# Patient Record
Sex: Female | Born: 1955 | Race: Black or African American | Hispanic: No | Marital: Married | Smoking: Never smoker
Health system: Southern US, Community
[De-identification: ages and names within clinical notes are randomized; demographics above are authoritative.]

## PROBLEM LIST (undated history)

## (undated) DIAGNOSIS — D259 Leiomyoma of uterus, unspecified: Secondary | ICD-10-CM

## (undated) DIAGNOSIS — R7302 Impaired glucose tolerance (oral): Secondary | ICD-10-CM

## (undated) DIAGNOSIS — M1990 Unspecified osteoarthritis, unspecified site: Secondary | ICD-10-CM

## (undated) DIAGNOSIS — I2510 Atherosclerotic heart disease of native coronary artery without angina pectoris: Secondary | ICD-10-CM

## (undated) DIAGNOSIS — E669 Obesity, unspecified: Secondary | ICD-10-CM

## (undated) DIAGNOSIS — R7303 Prediabetes: Secondary | ICD-10-CM

## (undated) DIAGNOSIS — I209 Angina pectoris, unspecified: Secondary | ICD-10-CM

## (undated) DIAGNOSIS — E7800 Pure hypercholesterolemia, unspecified: Secondary | ICD-10-CM

## (undated) HISTORY — DX: Impaired glucose tolerance (oral): R73.02

## (undated) HISTORY — PX: CORONARY ANGIOPLASTY WITH STENT PLACEMENT: SHX49

## (undated) HISTORY — DX: Obesity, unspecified: E66.9

---

## 1990-06-16 HISTORY — PX: TUBAL LIGATION: SHX77

## 1997-09-14 ENCOUNTER — Ambulatory Visit (HOSPITAL_COMMUNITY): Admission: RE | Admit: 1997-09-14 | Discharge: 1997-09-14 | Payer: Self-pay | Admitting: Obstetrics and Gynecology

## 1998-11-14 ENCOUNTER — Ambulatory Visit (HOSPITAL_COMMUNITY): Admission: RE | Admit: 1998-11-14 | Discharge: 1998-11-14 | Payer: Self-pay | Admitting: Obstetrics and Gynecology

## 1998-11-14 ENCOUNTER — Encounter: Payer: Self-pay | Admitting: Obstetrics and Gynecology

## 1998-11-26 ENCOUNTER — Other Ambulatory Visit: Admission: RE | Admit: 1998-11-26 | Discharge: 1998-11-26 | Payer: Self-pay | Admitting: Obstetrics and Gynecology

## 1999-11-07 ENCOUNTER — Ambulatory Visit (HOSPITAL_COMMUNITY): Admission: RE | Admit: 1999-11-07 | Discharge: 1999-11-07 | Payer: Self-pay | Admitting: Obstetrics and Gynecology

## 1999-11-07 ENCOUNTER — Encounter: Payer: Self-pay | Admitting: Obstetrics and Gynecology

## 1999-11-14 ENCOUNTER — Encounter: Payer: Self-pay | Admitting: Obstetrics and Gynecology

## 1999-11-14 ENCOUNTER — Encounter: Admission: RE | Admit: 1999-11-14 | Discharge: 1999-11-14 | Payer: Self-pay | Admitting: Obstetrics and Gynecology

## 2000-07-07 ENCOUNTER — Other Ambulatory Visit: Admission: RE | Admit: 2000-07-07 | Discharge: 2000-07-07 | Payer: Self-pay | Admitting: Obstetrics and Gynecology

## 2001-05-24 ENCOUNTER — Emergency Department (HOSPITAL_COMMUNITY): Admission: EM | Admit: 2001-05-24 | Discharge: 2001-05-24 | Payer: Self-pay

## 2001-10-12 ENCOUNTER — Other Ambulatory Visit: Admission: RE | Admit: 2001-10-12 | Discharge: 2001-10-12 | Payer: Self-pay | Admitting: Obstetrics and Gynecology

## 2002-08-24 ENCOUNTER — Ambulatory Visit (HOSPITAL_COMMUNITY): Admission: RE | Admit: 2002-08-24 | Discharge: 2002-08-24 | Payer: Self-pay | Admitting: Gastroenterology

## 2002-08-24 ENCOUNTER — Encounter: Payer: Self-pay | Admitting: Obstetrics and Gynecology

## 2002-10-25 ENCOUNTER — Other Ambulatory Visit: Admission: RE | Admit: 2002-10-25 | Discharge: 2002-10-25 | Payer: Self-pay | Admitting: Obstetrics and Gynecology

## 2003-11-22 ENCOUNTER — Ambulatory Visit (HOSPITAL_COMMUNITY): Admission: RE | Admit: 2003-11-22 | Discharge: 2003-11-22 | Payer: Self-pay | Admitting: *Deleted

## 2004-12-06 ENCOUNTER — Ambulatory Visit (HOSPITAL_COMMUNITY): Admission: RE | Admit: 2004-12-06 | Discharge: 2004-12-06 | Payer: Self-pay | Admitting: Obstetrics and Gynecology

## 2007-01-05 ENCOUNTER — Ambulatory Visit (HOSPITAL_COMMUNITY): Admission: RE | Admit: 2007-01-05 | Discharge: 2007-01-05 | Payer: Self-pay | Admitting: Obstetrics and Gynecology

## 2007-06-17 HISTORY — PX: HEMORRHOID BANDING: SHX5850

## 2008-01-07 ENCOUNTER — Ambulatory Visit (HOSPITAL_COMMUNITY): Admission: RE | Admit: 2008-01-07 | Discharge: 2008-01-07 | Payer: Self-pay | Admitting: Obstetrics and Gynecology

## 2009-01-12 ENCOUNTER — Ambulatory Visit (HOSPITAL_COMMUNITY): Admission: RE | Admit: 2009-01-12 | Discharge: 2009-01-12 | Payer: Self-pay | Admitting: Obstetrics and Gynecology

## 2010-06-20 ENCOUNTER — Ambulatory Visit (HOSPITAL_COMMUNITY)
Admission: RE | Admit: 2010-06-20 | Discharge: 2010-06-20 | Payer: Self-pay | Source: Home / Self Care | Attending: Obstetrics and Gynecology | Admitting: Obstetrics and Gynecology

## 2011-08-24 ENCOUNTER — Ambulatory Visit: Payer: BC Managed Care – PPO

## 2011-08-24 ENCOUNTER — Ambulatory Visit (INDEPENDENT_AMBULATORY_CARE_PROVIDER_SITE_OTHER): Payer: BC Managed Care – PPO | Admitting: Internal Medicine

## 2011-08-24 DIAGNOSIS — R7302 Impaired glucose tolerance (oral): Secondary | ICD-10-CM

## 2011-08-24 DIAGNOSIS — E669 Obesity, unspecified: Secondary | ICD-10-CM | POA: Insufficient documentation

## 2011-08-24 DIAGNOSIS — R0602 Shortness of breath: Secondary | ICD-10-CM

## 2011-08-24 DIAGNOSIS — R05 Cough: Secondary | ICD-10-CM

## 2011-08-24 DIAGNOSIS — R079 Chest pain, unspecified: Secondary | ICD-10-CM

## 2011-08-24 DIAGNOSIS — R5383 Other fatigue: Secondary | ICD-10-CM

## 2011-08-24 DIAGNOSIS — R7309 Other abnormal glucose: Secondary | ICD-10-CM

## 2011-08-24 LAB — POCT CBC
Hemoglobin: 13 g/dL (ref 12.2–16.2)
MPV: 9.3 fL (ref 0–99.8)
POC Granulocyte: 1.5 — AB (ref 2–6.9)
POC MID %: 5.2 %M (ref 0–12)
RBC: 4.55 M/uL (ref 4.04–5.48)
WBC: 3.9 10*3/uL — AB (ref 4.6–10.2)

## 2011-08-24 LAB — COMPREHENSIVE METABOLIC PANEL
ALT: 11 U/L (ref 0–35)
Alkaline Phosphatase: 52 U/L (ref 39–117)
Sodium: 142 mEq/L (ref 135–145)
Total Bilirubin: 0.5 mg/dL (ref 0.3–1.2)
Total Protein: 7.4 g/dL (ref 6.0–8.3)

## 2011-08-24 LAB — LIPID PANEL
LDL Cholesterol: 114 mg/dL — ABNORMAL HIGH (ref 0–99)
Triglycerides: 99 mg/dL (ref <150)
VLDL: 20 mg/dL (ref 0–40)

## 2011-08-24 LAB — GLUCOSE, POCT (MANUAL RESULT ENTRY): POC Glucose: 84

## 2011-08-24 LAB — POCT SEDIMENTATION RATE: POCT SED RATE: 24 mm/hr — AB (ref 0–22)

## 2011-08-24 MED ORDER — HYDROCODONE-ACETAMINOPHEN 7.5-500 MG/15ML PO SOLN: 5.0000 mL | Freq: Four times a day (QID) | ORAL | Status: AC | PRN

## 2011-08-24 NOTE — Progress Notes (Signed)
  Subjective:    Patient ID: Emma Combs, female    DOB: [birth year], 56 y.o.   MRN: [id number]  HPI 56 y/o [place] female with hx of glucose intol. And obesity presents with vague chest pain , arm  Pain,  Fatigue and sob.  3 wks ago was hauling stuff to her daughters dorm room  And felt chest pressure, arm pain and extreme fatigue. She feels better now but still fatigued.  Also has had a URI with cough recently.   Review of Systems As above    Objective:   Physical Exam  Constitutional: She is oriented to person, place, and time. She appears well-developed and well-nourished.  HENT:  Nose: Nose normal.  Mouth/Throat: Oropharynx is clear and moist. No oropharyngeal exudate.  Eyes: EOM are normal.  Neck: Normal range of motion. No thyromegaly present.  Cardiovascular: Normal rate, regular rhythm and normal heart sounds.   Pulmonary/Chest: Effort normal and breath sounds normal.  Abdominal: Soft. Bowel sounds are normal. There is no tenderness.  Musculoskeletal: Normal range of motion.  Neurological: She is alert and oriented to person, place, and time. Coordination normal.  Skin: Skin is warm and dry.  Psychiatric: She has a normal mood and affect.    [facility] reading (PRIMARY) by  Dr. Perrin Maltese normal CXR  Results for orders placed in visit on 08/24/11  POCT CBC      Component Value Range   WBC 3.9 (*) 4.6 - 10.2 (K/uL)   Lymph, poc 2.2  0.6 - 3.4    POC LYMPH PERCENT 56.2 (*) 10 - 50 (%L)   MID (cbc) 0.2  0 - 0.9    POC MID % 5.2  0 - 12 (%M)   POC Granulocyte 1.5 (*) 2 - 6.9    Granulocyte percent 38.6  37 - 80 (%G)   RBC 4.55  4.04 - 5.48 (M/uL)   Hemoglobin 13.0  12.2 - 16.2 (g/dL)   HCT, POC 40.9  81.1 - 47.9 (%)   MCV 89.4  80 - 97 (fL)   MCH, POC 28.6  27 - 31.2 (pg)   MCHC 31.9  31.8 - 35.4 (g/dL)   RDW, POC 91.4     Platelet Count, POC 163  142 - 424 (K/uL)   MPV 9.3  0 - 99.8 (fL)  GLUCOSE, POCT (MANUAL RESULT ENTRY)      Component Value Range   POC Glucose 84     POCT GLYCOSYLATED HEMOGLOBIN (HGB A1C)      Component Value Range   Hemoglobin A1C 6.1         Labs above    Assessment & Plan:   Chest pain cause unclear with nl cxr and ekg Fatigue cause unclear Cough resolving LRI. R/o CAD, refer to cardiology for full evaluation  Best phone# [phone]  Lose wt., take 81mg  asa qd.Lateasha Breuer WARREN

## 2011-08-24 NOTE — Patient Instructions (Signed)
Chest Pain (Nonspecific) It is often hard to give a specific diagnosis for the cause of chest pain. There is always a chance that your pain could be related to something serious, such as a heart attack or a blood clot in the lungs. You need to follow up with your caregiver for further evaluation. CAUSES   Heartburn.   Pneumonia or bronchitis.   Anxiety or stress.   Inflammation around your heart (pericarditis) or lung (pleuritis or pleurisy).   A blood clot in the lung.   A collapsed lung (pneumothorax). It can develop suddenly on its own (spontaneous pneumothorax) or from injury (trauma) to the chest.   Shingles infection (herpes zoster virus).  The chest wall is composed of bones, muscles, and cartilage. Any of these can be the source of the pain.  The bones can be bruised by injury.   The muscles or cartilage can be strained by coughing or overwork.   The cartilage can be affected by inflammation and become sore (costochondritis).  DIAGNOSIS  Lab tests or other studies, such as X-rays, electrocardiography, stress testing, or cardiac imaging, may be needed to find the cause of your pain.  TREATMENT   Treatment depends on what may be causing your chest pain. Treatment may include:   Acid blockers for heartburn.   Anti-inflammatory medicine.   Pain medicine for inflammatory conditions.   Antibiotics if an infection is present.   You may be advised to change lifestyle habits. This includes stopping smoking and avoiding alcohol, caffeine, and chocolate.   You may be advised to keep your head raised (elevated) when sleeping. This reduces the chance of acid going backward from your stomach into your esophagus.   Most of the time, nonspecific chest pain will improve within 2 to 3 days with rest and mild pain medicine.  HOME CARE INSTRUCTIONS   If antibiotics were prescribed, take your antibiotics as directed. Finish them even if you start to feel better.   For the next few  days, avoid physical activities that bring on chest pain. Continue physical activities as directed.   Do not smoke.   Avoid drinking alcohol.   Only take over-the-counter or prescription medicine for pain, discomfort, or fever as directed by your caregiver.   Follow your caregiver's suggestions for further testing if your chest pain does not go away.   Keep any follow-up appointments you made. If you do not go to an appointment, you could develop lasting (chronic) problems with pain. If there is any problem keeping an appointment, you must call to reschedule.  SEEK MEDICAL CARE IF:   You think you are having problems from the medicine you are taking. Read your medicine instructions carefully.   Your chest pain does not go away, even after treatment.   You develop a rash with blisters on your chest.  SEEK IMMEDIATE MEDICAL CARE IF:   You have increased chest pain or pain that spreads to your arm, neck, jaw, back, or abdomen.   You develop shortness of breath, an increasing cough, or you are coughing up blood.   You have severe back or abdominal pain, feel nauseous, or vomit.   You develop severe weakness, fainting, or chills.   You have a fever.  THIS IS AN EMERGENCY. Do not wait to see if the pain will go away. Get medical help at once. Call your local emergency services (911 in [place] Do not drive yourself to the hospital. MAKE SURE YOU:   Understand these instructions.     Will watch your condition.   Will get help right away if you are not doing well or get worse.  Document Released: 03/12/2005 Document Revised: 05/22/2011 Document Reviewed: 01/06/2008 Exit[employer] Patient Information 2012 [employer]. 

## 2011-08-26 ENCOUNTER — Encounter: Payer: Self-pay | Admitting: *Deleted

## 2011-09-03 ENCOUNTER — Institutional Professional Consult (permissible substitution): Payer: BC Managed Care – PPO | Admitting: Cardiology

## 2011-09-26 ENCOUNTER — Institutional Professional Consult (permissible substitution): Payer: BC Managed Care – PPO | Admitting: Cardiology

## 2011-10-15 ENCOUNTER — Ambulatory Visit (INDEPENDENT_AMBULATORY_CARE_PROVIDER_SITE_OTHER): Payer: BC Managed Care – PPO | Admitting: Physician Assistant

## 2011-10-15 VITALS — BP 131/84 | HR 71 | Temp 98.1°F | Resp 18 | Ht 65.0 in | Wt 209.0 lb

## 2011-10-15 DIAGNOSIS — M25569 Pain in unspecified knee: Secondary | ICD-10-CM

## 2011-10-15 MED ORDER — MELOXICAM 15 MG PO TABS: 15.0000 mg | ORAL_TABLET | Freq: Every day | ORAL | Status: AC

## 2011-10-15 NOTE — Patient Instructions (Signed)
You make take the new prescription, Meloxicam with the liquid hydrocodone that you have.  Stop the naproxen.  Do not take ibuprofen, advil, aleeve. Heat the injured area.  Wear the ace wrap for compression or purchase a knee sleeve from the pharmacy. Watch for any lower leg swelling or swelling of the knee. Call if you have any questions.

## 2011-10-15 NOTE — Progress Notes (Signed)
  Subjective:    Patient ID: Emma Combs, female    DOB: Aug 25, 1955, 56 y.o.   MRN: [id number]  HPI Ms. Montelongo comes in tonight c/o 2 days of right posterior medial knee pain that started just after she got into her car, driverside.  Got worse as she drove home and has continued to be painful.  Took old naproxen day one that helped then but yesterday it was much worse and couldn't sleep last night.  Lifting knee hurts the most. She does not remember a specific injury and did nothing new. She has never had trouble with her knees in the past.  Healthy otherwise.   Review of Systems  Constitutional: Negative for fever.  Respiratory: Negative for shortness of breath.   Cardiovascular: Negative for chest pain.  Musculoskeletal: Positive for arthralgias. Negative for joint swelling.  Neurological: Negative for weakness.       Objective:   Physical Exam  Cardiovascular: Intact distal pulses.  Exam reveals no decreased pulses.   Musculoskeletal:       Right knee: She exhibits decreased range of motion. She exhibits no swelling, no effusion, no ecchymosis, no deformity and no erythema. no tenderness found. No medial joint line, no lateral joint line, no MCL, no LCL and no patellar tendon tenderness noted.       Legs:      Pain at medial hamstring insertion with resistive knee flexion at 90 degrees.  No pain with knee extension. No calf tenderness.   Skin: Skin is warm and dry.          Assessment & Plan:  Knee pain/strain  ACE wrap for compression or purchase knee sleeve D/C Naproxen. Start Meloxicam Pt has lortab elixir.  Will use for worsening pain at night. Heat. Elevate. Watch for swelling, calf pain or worsening sx.

## 2012-01-21 ENCOUNTER — Other Ambulatory Visit (HOSPITAL_COMMUNITY): Payer: Self-pay | Admitting: Obstetrics & Gynecology

## 2012-01-21 DIAGNOSIS — Z1231 Encounter for screening mammogram for malignant neoplasm of breast: Secondary | ICD-10-CM

## 2012-02-03 ENCOUNTER — Ambulatory Visit (HOSPITAL_COMMUNITY)
Admission: RE | Admit: 2012-02-03 | Discharge: 2012-02-03 | Disposition: A | Discharge status: Home / Self Care | Payer: BC Managed Care – PPO | Source: Ambulatory Visit | Attending: Obstetrics & Gynecology | Admitting: Obstetrics & Gynecology

## 2012-02-03 DIAGNOSIS — Z1231 Encounter for screening mammogram for malignant neoplasm of breast: Secondary | ICD-10-CM | POA: Insufficient documentation

## 2013-01-14 ENCOUNTER — Other Ambulatory Visit (HOSPITAL_COMMUNITY): Payer: Self-pay | Admitting: Obstetrics & Gynecology

## 2013-01-14 DIAGNOSIS — Z1231 Encounter for screening mammogram for malignant neoplasm of breast: Secondary | ICD-10-CM

## 2013-02-04 ENCOUNTER — Ambulatory Visit (HOSPITAL_COMMUNITY)
Admission: RE | Admit: 2013-02-04 | Discharge: 2013-02-04 | Disposition: A | Discharge status: Home / Self Care | Payer: BC Managed Care – PPO | Source: Ambulatory Visit | Attending: Obstetrics & Gynecology | Admitting: Obstetrics & Gynecology

## 2013-02-04 DIAGNOSIS — Z1231 Encounter for screening mammogram for malignant neoplasm of breast: Secondary | ICD-10-CM | POA: Insufficient documentation

## 2013-05-28 ENCOUNTER — Ambulatory Visit: Payer: BC Managed Care – PPO

## 2013-05-28 ENCOUNTER — Ambulatory Visit (INDEPENDENT_AMBULATORY_CARE_PROVIDER_SITE_OTHER): Payer: BC Managed Care – PPO | Admitting: Family Medicine

## 2013-05-28 VITALS — BP 126/74 | HR 93 | Temp 97.9°F | Resp 16 | Ht 65.0 in | Wt 217.0 lb

## 2013-05-28 DIAGNOSIS — M25531 Pain in right wrist: Secondary | ICD-10-CM

## 2013-05-28 DIAGNOSIS — M65839 Other synovitis and tenosynovitis, unspecified forearm: Secondary | ICD-10-CM

## 2013-05-28 DIAGNOSIS — M778 Other enthesopathies, not elsewhere classified: Secondary | ICD-10-CM

## 2013-05-28 DIAGNOSIS — M25539 Pain in unspecified wrist: Secondary | ICD-10-CM

## 2013-05-28 MED ORDER — DICLOFENAC SODIUM 75 MG PO TBEC: 75.0000 mg | DELAYED_RELEASE_TABLET | Freq: Two times a day (BID) | ORAL | Status: DC

## 2013-05-28 NOTE — Patient Instructions (Signed)
Try to rest the wrist as much as possible  Take diclofenac one pill twice daily with food at breakfast and supper for the tendinitis of the wrist  Wear the wrist splint as directed.  Return if not improving

## 2013-05-28 NOTE — Progress Notes (Signed)
Subjective: 57 year old lady who is here with problems with pain in her right forearm and wrist and hand. This been going on for some time. She does use her hands a lot as she is right-handed and works as a Engineer, site. Has not really taken or done anything for it. No specific injury.  Objective: Pleasant lady in no major distress. Her right hand appears to have a little swelling on the back of the wrist. When she moves the wrist around it hurts her a good deal. When she grips tightly and stabilize his wrist he really doesn't seem to cause her much pain. There is pain both on flexion extension and lateral movements.  Assessment: Right wrist pain  Plan: X-ray wrist  [facility] reading (PRIMARY) by  Dr. Alwyn Ren Normal wrist  Assessment: Tendinitis right wrist  Plan: Wrist splint Diclofenac 75 mg one twice daily for 2 weeks with one refill  Return if problems persist.

## 2013-07-23 ENCOUNTER — Emergency Department (HOSPITAL_COMMUNITY)
Admission: EM | Admit: 2013-07-23 | Discharge: 2013-07-23 | Disposition: A | Discharge status: Home / Self Care | Payer: BC Managed Care – PPO | Attending: Emergency Medicine | Admitting: Emergency Medicine

## 2013-07-23 ENCOUNTER — Emergency Department (HOSPITAL_COMMUNITY): Payer: BC Managed Care – PPO

## 2013-07-23 ENCOUNTER — Ambulatory Visit (INDEPENDENT_AMBULATORY_CARE_PROVIDER_SITE_OTHER): Payer: BC Managed Care – PPO | Admitting: Family Medicine

## 2013-07-23 ENCOUNTER — Encounter (HOSPITAL_COMMUNITY): Payer: Self-pay | Admitting: Emergency Medicine

## 2013-07-23 VITALS — BP 132/90 | HR 78 | Temp 98.2°F | Resp 18 | Wt 212.0 lb

## 2013-07-23 DIAGNOSIS — Z79899 Other long term (current) drug therapy: Secondary | ICD-10-CM | POA: Insufficient documentation

## 2013-07-23 DIAGNOSIS — R079 Chest pain, unspecified: Secondary | ICD-10-CM

## 2013-07-23 DIAGNOSIS — R0602 Shortness of breath: Secondary | ICD-10-CM | POA: Insufficient documentation

## 2013-07-23 DIAGNOSIS — R0789 Other chest pain: Secondary | ICD-10-CM | POA: Insufficient documentation

## 2013-07-23 DIAGNOSIS — E669 Obesity, unspecified: Secondary | ICD-10-CM | POA: Insufficient documentation

## 2013-07-23 LAB — POCT CBC
GRANULOCYTE PERCENT: 45 % (ref 37–80)
HCT, POC: 45.5 % (ref 37.7–47.9)
Hemoglobin: 14.2 g/dL (ref 12.2–16.2)
Lymph, poc: 1.9 (ref 0.6–3.4)
MCH: 29.6 pg (ref 27–31.2)
MCHC: 31.2 g/dL — AB (ref 31.8–35.4)
MCV: 94.8 fL (ref 80–97)
MID (CBC): 0.3 (ref 0–0.9)
MPV: 9.9 fL (ref 0–99.8)
PLATELET COUNT, POC: 153 10*3/uL (ref 142–424)
POC Granulocyte: 1.8 — AB (ref 2–6.9)
POC LYMPH %: 48 % (ref 10–50)
POC MID %: 7 % (ref 0–12)
RBC: 4.8 M/uL (ref 4.04–5.48)
RDW, POC: 13.6 %
WBC: 3.9 10*3/uL — AB (ref 4.6–10.2)

## 2013-07-23 LAB — BASIC METABOLIC PANEL
BUN: 12 mg/dL (ref 6–23)
CALCIUM: 9.2 mg/dL (ref 8.4–10.5)
CO2: 25 mEq/L (ref 19–32)
Chloride: 106 mEq/L (ref 96–112)
Creatinine, Ser: 0.73 mg/dL (ref 0.50–1.10)
Glucose, Bld: 103 mg/dL — ABNORMAL HIGH (ref 70–99)
Potassium: 4.2 mEq/L (ref 3.7–5.3)
SODIUM: 144 meq/L (ref 137–147)

## 2013-07-23 LAB — CBC WITH DIFFERENTIAL/PLATELET
BASOS ABS: 0 10*3/uL (ref 0.0–0.1)
Basophils Relative: 0 % (ref 0–1)
EOS ABS: 0 10*3/uL (ref 0.0–0.7)
EOS PCT: 1 % (ref 0–5)
HCT: 38.5 % (ref 36.0–46.0)
Hemoglobin: 13.4 g/dL (ref 12.0–15.0)
Lymphocytes Relative: 52 % — ABNORMAL HIGH (ref 12–46)
Lymphs Abs: 1.9 10*3/uL (ref 0.7–4.0)
MCH: 30.7 pg (ref 26.0–34.0)
MCHC: 34.8 g/dL (ref 30.0–36.0)
MCV: 88.1 fL (ref 78.0–100.0)
Monocytes Absolute: 0.2 10*3/uL (ref 0.1–1.0)
Monocytes Relative: 4 % (ref 3–12)
NEUTROS PCT: 43 % (ref 43–77)
Neutro Abs: 1.6 10*3/uL — ABNORMAL LOW (ref 1.7–7.7)
PLATELETS: 134 10*3/uL — AB (ref 150–400)
RBC: 4.37 MIL/uL (ref 3.87–5.11)
RDW: 12.5 % (ref 11.5–15.5)
WBC: 3.7 10*3/uL — AB (ref 4.0–10.5)

## 2013-07-23 LAB — POCT I-STAT TROPONIN I
TROPONIN I, POC: 0.05 ng/mL (ref 0.00–0.08)
Troponin i, poc: 0.05 ng/mL (ref 0.00–0.08)

## 2013-07-23 LAB — PRO B NATRIURETIC PEPTIDE: PRO B NATRI PEPTIDE: 13.8 pg/mL (ref 0–125)

## 2013-07-23 LAB — GLUCOSE, POCT (MANUAL RESULT ENTRY): POC Glucose: 107 mg/dl — AB (ref 70–99)

## 2013-07-23 LAB — D-DIMER, QUANTITATIVE (NOT AT ARMC): D-Dimer, Quant: 0.4 ug/mL-FEU (ref 0.00–0.48)

## 2013-07-23 MED ORDER — ASPIRIN 81 MG PO CHEW
324.0000 mg | CHEWABLE_TABLET | Freq: Once | ORAL | Status: AC
  Administered 2013-07-23: 324 mg via ORAL

## 2013-07-23 NOTE — ED Provider Notes (Signed)
Medical screening examination/treatment/procedure(s) were conducted as a shared visit with non-physician practitioner(s) and myself.  I personally evaluated the patient during the encounter.  EKG Interpretation    Date/Time:  Saturday July 23 2013 09:38:28 EST Ventricular Rate:  70 PR Interval:  191 QRS Duration: 87 QT Interval:  406 QTC Calculation: 438 R Axis:   44 Text Interpretation:  Sinus rhythm No significant change since last tracing Confirmed by Winfred Leeds  MD, Dayvin Aber ([id number]) on 07/23/2013 10:12:48 AM             Orlie Dakin, MD 07/23/13 1655

## 2013-07-23 NOTE — ED Provider Notes (Signed)
Complains of left-sided posterior chest pain onset 07/19/2013, pleuritic, worse with walking and worse with changing positions, stating it caused her to "breathe fast. She was evaluated at [facility] treated and released after spending 24 hours inpatient. Advised to followup with her primary care physician. Patient sought urgent medical today. Sent here for further evaluation. Her pain presently is minimal. Cardiac risk factors: Father had stroke at age 76. Patient is uncertain of her cholesterol otherwise negative. On exam alert Glasgow Coma Score 15 no distress HEENT exam (moist pink not icteric neck supple trachea midline lungs clear auscultation heart regular rate and rhythm abdomen nondistended nontender chest is mildly tender at posterior lateral ribs left side. Pain is reproducible by forcible flexion of left shoulder. All 4 extremities without edema, neurovascularly intact. Heart score equals 2  Orlie Dakin, MD 07/23/13 [phone]

## 2013-07-23 NOTE — ED Notes (Signed)
Patient transported to X-ray 

## 2013-07-23 NOTE — ED Notes (Addendum)
Pt returned to exam room. Denies chest pain. Vital signs stable. NAD.

## 2013-07-23 NOTE — ED Provider Notes (Signed)
CSN: [id number]     Arrival date & time 07/23/13  1950 History   First MD Initiated Contact with Patient 07/23/13 [phone]     Chief Complaint  Patient presents with  . Chest Pain  . Shortness of Breath   (Consider location/radiation/quality/duration/timing/severity/associated sxs/prior Treatment) Patient is a 58 y.o. female presenting with chest pain and shortness of breath. The history is provided by the patient and medical records.  Chest Pain Associated symptoms: shortness of breath   Shortness of Breath Associated symptoms: chest pain    This is a 58 y.o. F presenting to the ED via EMS from Urgent Care for further evaluation of chest pain and shortness of breath.  Chest pain intermittent for the past 2 weeks, described as a squeezing sensation with occasional sharp sensations associated with SOB and palpitations.  Some radiation of pain to LUE and upper back.  States symptoms mostly occur with exertion, specifically after walking briskly, going up stairs, lifting her bags for work, and changing position.  Denies any excessively heavy lifting to cause muscle strain.  Pt was recently hospitalized at [facility] following a syncopal event.  She had echocardiogram performed revealing mild CHF, no other significant findings.  She was told to FU with a cardiologist in the area but has not been able to do so yet.  On arrival to the ED, pt pain free.  Pt given 325 ASA at urgent care.  VS stable.  Past Medical History  Diagnosis Date  . Glucose intolerance (impaired glucose tolerance)   . Obesity   . Obesity    History reviewed. No pertinent past surgical history. Family History  Problem Relation Age of Onset  . Hyperlipidemia Mother   . Cancer Mother     leukemia  . Diabetes Mother   . Diabetes Father   . Hyperlipidemia Father   . Stroke Father   . Cancer Sister   . Diabetes Sister   . Hyperlipidemia Sister   . Hypertension Sister   . Hypertension Brother   . Hyperlipidemia Brother   .  Diabetes Brother    History  Substance Use Topics  . Smoking status: Never Smoker   . Smokeless tobacco: Not on file  . Alcohol Use: 0.6 oz/week    1 Glasses of wine per week   OB History   Grav Para Term Preterm Abortions TAB SAB Ect Mult Living                 Review of Systems  Respiratory: Positive for shortness of breath.   Cardiovascular: Positive for chest pain.  All other systems reviewed and are negative.    Allergies  Bactrim  Home Medications   Current Outpatient Rx  Name  Route  Sig  Dispense  Refill  . diclofenac (VOLTAREN) 75 MG EC tablet   Oral   Take 1 tablet (75 mg total) by mouth 2 (two) times daily.   30 tablet   1    BP 128/84  Pulse 77  Temp(Src) 98.2 F (36.8 C) (Oral)  Resp 20  SpO2 100%  Physical Exam  Nursing note and vitals reviewed. Constitutional: She is oriented to person, place, and time. She appears well-developed and well-nourished. No distress.  HENT:  Head: Normocephalic and atraumatic.  Mouth/Throat: Oropharynx is clear and moist.  Eyes: Conjunctivae and EOM are normal. Pupils are equal, round, and reactive to light.  Neck: Normal range of motion. Neck supple.  Cardiovascular: Normal rate, regular rhythm and normal heart sounds.  Pulmonary/Chest: Effort normal and breath sounds normal. No respiratory distress. She has no decreased breath sounds. She has no wheezes.  Some pain with palpation to left anterior chest, left lateral ribs, and left scapular region  Abdominal: Soft. Bowel sounds are normal.  Musculoskeletal: Normal range of motion. She exhibits no edema.  Neurological: She is alert and oriented to person, place, and time.  Skin: Skin is warm and dry. She is not diaphoretic.  Psychiatric: She has a normal mood and affect.    ED Course  Procedures (including critical care time) Labs Review Labs Reviewed  CBC WITH DIFFERENTIAL - Abnormal; Notable for the following:    WBC 3.7 (*)    Platelets 134 (*)     Neutro Abs 1.6 (*)    Lymphocytes Relative 52 (*)    All other components within normal limits  BASIC METABOLIC PANEL - Abnormal; Notable for the following:    Glucose, Bld 103 (*)    All other components within normal limits  PRO B NATRIURETIC PEPTIDE  D-DIMER, QUANTITATIVE  POCT I-STAT TROPONIN I  POCT I-STAT TROPONIN I   Imaging Review Dg Chest 2 View  07/23/2013   CLINICAL DATA:  Left chest and arm pain.  Shortness of breath.  EXAM: CHEST  2 VIEW  COMPARISON:  08/24/2011  FINDINGS: Poor inspiration with no gross change in borderline enlargement of the cardiac silhouette. Clear lungs with normal vascularity. Interval mild diffuse peribronchial thickening. Unremarkable bones.  IMPRESSION: 1. Interval mild bronchitic changes. 2. Stable borderline cardiomegaly   Electronically Signed   By: Enrique Sack M.D.   On: 07/23/2013 11:38    EKG Interpretation    Date/Time:  Saturday July 23 2013 09:38:28 EST Ventricular Rate:  70 PR Interval:  191 QRS Duration: 87 QT Interval:  406 QTC Calculation: 438 R Axis:   44 Text Interpretation:  Sinus rhythm No significant change since last tracing Confirmed by JACUBOWITZ  MD, SAM ([id number]) on 07/23/2013 10:12:48 AM            MDM   1. Chest pain    EKG normal sinus rhythm, no acute ischemic changes. Troponin is negative. Chest x-ray is clear. Labs are reassuring.  Patient remains chest pain-free.  On physical exam she does have mild pain with palpation to her left chest wall, lateral ribs, and left scapular region.  Sx also appear to have somewhat of a pleuritic component. Will obtain delta troponin.  Delta troponin is negative. Patient has remained pain-free throughout her entire ED visit, vital signs have remained stable. At this time I have low suspicion for ACS, PE, dissection, or other acute cardiac event.  Patient will followup with [facility], I've advised her to contact their office Monday morning to schedule an appointment.  Signs and symptoms that would warrant ED returned including recurrent chest pain, shortness of breath, palpitations, dizziness, lightheadedness, or feelings of syncope were discussed-- pt  acknowledged understanding and agreed with plan of care.  Discussed with Dr. Winfred Leeds who personally evaluated pt and agrees with plan of care.  Larene Pickett, PA-C 07/23/13 1501

## 2013-07-23 NOTE — ED Notes (Signed)
Pt refused chest xray. Provider notified.

## 2013-07-23 NOTE — Progress Notes (Signed)
Subjective:   This chart was scribed for Emma Honour, MD by Forrestine Him, [facility] Scribe. This patient was seen in room and the patient's care was started 8:21 AM.    Patient ID: Emma Combs, female    DOB: 12-Jun-1956, 58 y.o.   MRN: [id number]  HPI  HPI Comments: Emma Combs is a 58 y.o. female who presents to [facility] complaining of sudden onset, severe chest pain and chest wall tenderness described as "pressure" and "tightness" with associated SOB, nausea, indigestion, and chills that initially started two weeks ago, but recently worsened today at 5 AM this morning. Pt also reports radiating pain into her back and down her left arm. She admits to leg pain, weakness, and leg swelling bilaterally over past few weeks. Pt states ambulation and carrying heavy objects exacerbates her pain. She denies any alleviating factors. She has not tried anything OTC for her discomfort.  She states on 2/3 she had a near syncope episode along with a "shooting pain" feeling into her head, and mild chest pain. She states her BP was gradually worsening and increasing during this time; BP was 170/90 at [facility]. She was admitted and treated at [facility] on 2/3 where a normal echocardiogram (other than diastolic CHF mild was detected), CT head and blood panel was performed and were normal. She says eventually her BP returned to normal. Pt reports seeing a cardiologist during her stay at [facility], and was diagnosed with mild diastolic heart failure. No medications were started, and she was advised to see her PCP for further evaluation . Pt states she returned to work on Thursday and Friday and reports being very fatigue, needing to stop from time to time to rest.  She also has suffered with indigestion associated with this chest pain.  She reports gaining unexpected weight lately, despite attempting to loose weight. While at [facility], she states she screened for  thyroid abnormalities with no pertinent findings.  She denies HTN or DM. She denies currently being a smoker, and states she socially drinks wine.  She is not exercising.  Father died at 96 of a stroke and states he was hypertensive. Her mother died at 62 of Leukemia and also reports cardiac issues. She states her brother has a heart condition with recent cardiac stening? and is currently 24.  Pt states she currently is not experiencing any discomfort or pain at this time.  Review of Systems  Constitutional: Positive for chills, diaphoresis, fatigue and unexpected weight change. Negative for fever and appetite change.  HENT: Negative for congestion.   Eyes: Negative for visual disturbance.  Respiratory: Positive for chest tightness and shortness of breath. Negative for cough and wheezing.   Cardiovascular: Positive for chest pain and leg swelling.  Gastrointestinal: Positive for nausea. Negative for vomiting and diarrhea.  Musculoskeletal: Positive for back pain and myalgias.  Neurological: Positive for weakness.  Psychiatric/Behavioral: The patient is not nervous/anxious.     Past Medical History  Diagnosis Date  . Glucose intolerance (impaired glucose tolerance)   . Obesity   . Obesity     History reviewed. No pertinent past surgical history.   History   Social History  . Marital Status: Married    Spouse Name: N/A    Number of Children: N/A  . Years of Education: N/A   Occupational History  . Not on file.   Social History Main Topics  . Smoking status: Never Smoker   .  Smokeless tobacco: Not on file  . Alcohol Use: Not on file  . Drug Use: Not on file  . Sexual Activity: Not on file   Other Topics Concern  . Not on file   Social History Narrative  . No narrative on file    Triage Vitals: BP 132/90  Pulse 78  Temp(Src) 98.2 F (36.8 C) (Oral)  Resp 18  Wt 212 lb (96.163 kg)  SpO2 98%   Objective:  Physical Exam  Nursing note and vitals  reviewed. Constitutional: She is oriented to person, place, and time. She appears well-developed and well-nourished.  HENT:  Head: Normocephalic and atraumatic.  Right Ear: External ear normal.  Left Ear: External ear normal.  Nose: Nose normal.  Mouth/Throat: Oropharynx is clear and moist.  Eyes: Conjunctivae and EOM are normal. Pupils are equal, round, and reactive to light. Right eye exhibits no discharge. Left eye exhibits no discharge. No scleral icterus.  Neck: Normal range of motion. Neck supple. No JVD present. No tracheal deviation present. No thyromegaly present.  Cardiovascular: Normal rate, regular rhythm, normal heart sounds and intact distal pulses.  Exam reveals no gallop and no friction rub.   No murmur heard. Pulmonary/Chest: Effort normal and breath sounds normal. No stridor. No respiratory distress. She has no wheezes. She has no rales. She exhibits no tenderness.  Abdominal: Soft. Bowel sounds are normal. She exhibits no distension and no mass. There is no tenderness. There is no rebound and no guarding.  Musculoskeletal: Normal range of motion. She exhibits no edema and no tenderness.       Right shoulder: Normal. She exhibits normal range of motion and no pain.       Left shoulder: Normal. She exhibits normal range of motion and no pain.       Cervical back: Normal. She exhibits normal range of motion and no pain.       Thoracic back: She exhibits normal range of motion, no tenderness and no bony tenderness.  Mild TTP with palpation of anterior chest wall; no thoracic TTP; full ROM cervical spine without pain.  Lymphadenopathy:    She has no cervical adenopathy.  Neurological: She is alert and oriented to person, place, and time.  Skin: Skin is warm and dry. No rash noted. She is not diaphoretic. No erythema. No pallor.  Psychiatric: She has a normal mood and affect. Her behavior is normal. Judgment and thought content normal.   EKG: NSR; no acute ST  changes.  Results for orders placed in visit on 07/23/13  POCT CBC      Result Value Range   WBC 3.9 (*) 4.6 - 10.2 K/uL   Lymph, poc 1.9  0.6 - 3.4   POC LYMPH PERCENT 48.0  10 - 50 %L   MID (cbc) 0.3  0 - 0.9   POC MID % 7.0  0 - 12 %M   POC Granulocyte 1.8 (*) 2 - 6.9   Granulocyte percent 45.0  37 - 80 %G   RBC 4.80  4.04 - 5.48 M/uL   Hemoglobin 14.2  12.2 - 16.2 g/dL   HCT, POC 45.5  37.7 - 47.9 %   MCV 94.8  80 - 97 fL   MCH, POC 29.6  27 - 31.2 pg   MCHC 31.2 (*) 31.8 - 35.4 g/dL   RDW, POC 13.6     Platelet Count, POC 153  142 - 424 K/uL   MPV 9.9  0 - 99.8 fL  GLUCOSE,  POCT (MANUAL RESULT ENTRY)      Result Value Range   POC Glucose 107 (*) 70 - 99 mg/dl   ASA 81mg  x 4 po administered in office.  IV placement in office.  Assessment & Plan:  Chest pain - Plan: EKG 12-Lead, aspirin chewable tablet 324 mg, POCT CBC, POCT glucose (manual entry)  1. Chest pain: New onset and progressively worsening over the past two weeks.  Exertional component to chest pain; s/p ASA 81 mg x 4 in office; transport via EMS for rule out.  Pain free at this time.   I personally performed the services described in this documentation, which was scribed in my presence.  The recorded information has been reviewed and is accurate.  Reginia Forts, M.D.  [facility] [address] [place], [place]  [address] [phone] phone [phone] fax

## 2013-07-23 NOTE — ED Notes (Addendum)
Pt presents to department via [facility] from [facility] for intermittent chest pain x2 weeks. States midsternal non radiating chest pain and SOB. Denies pain at the time. Respirations unlabored upon arrival. Pt is alert and oriented x4. NSR on monitor. 20g LAC. Received 324 ASA prior to arrival by UC.

## 2013-07-23 NOTE — Discharge Instructions (Signed)
Follow-up with [facility] cardiology-- call office Monday morning to schedule an appt. Return to the ED for new or worsening symptoms-- recurrent chest pain, SOB, dizziness, lightheadedness, feeling of syncope, etc.

## 2013-07-23 NOTE — ED Notes (Signed)
Pt to be transported back to x-ray.

## 2013-08-03 ENCOUNTER — Encounter (HOSPITAL_COMMUNITY): Payer: Self-pay | Admitting: Pharmacy Technician

## 2013-08-11 ENCOUNTER — Encounter (HOSPITAL_COMMUNITY)
Admission: RE | Disposition: A | Discharge status: Home / Self Care | Payer: BC Managed Care – PPO | Source: Ambulatory Visit | Attending: Family Medicine

## 2013-08-11 ENCOUNTER — Encounter (HOSPITAL_COMMUNITY): Payer: Self-pay | Admitting: General Practice

## 2013-08-11 ENCOUNTER — Ambulatory Visit (HOSPITAL_COMMUNITY)
Admission: RE | Admit: 2013-08-11 | Discharge: 2013-08-12 | Disposition: A | Discharge status: Home / Self Care | Payer: BC Managed Care – PPO | Source: Ambulatory Visit | Attending: Family Medicine | Admitting: Family Medicine

## 2013-08-11 DIAGNOSIS — R0789 Other chest pain: Secondary | ICD-10-CM | POA: Insufficient documentation

## 2013-08-11 DIAGNOSIS — E785 Hyperlipidemia, unspecified: Secondary | ICD-10-CM | POA: Insufficient documentation

## 2013-08-11 DIAGNOSIS — Z9861 Coronary angioplasty status: Secondary | ICD-10-CM

## 2013-08-11 DIAGNOSIS — I10 Essential (primary) hypertension: Secondary | ICD-10-CM | POA: Insufficient documentation

## 2013-08-11 DIAGNOSIS — Z955 Presence of coronary angioplasty implant and graft: Secondary | ICD-10-CM

## 2013-08-11 DIAGNOSIS — I2510 Atherosclerotic heart disease of native coronary artery without angina pectoris: Secondary | ICD-10-CM | POA: Insufficient documentation

## 2013-08-11 HISTORY — DX: Prediabetes: R73.03

## 2013-08-11 HISTORY — PX: CORONARY STENT PLACEMENT: SHX1402

## 2013-08-11 HISTORY — PX: LEFT AND RIGHT HEART CATHETERIZATION WITH CORONARY ANGIOGRAM: SHX5449

## 2013-08-11 LAB — POCT I-STAT 3, ART BLOOD GAS (G3+)
Acid-Base Excess: 1 mmol/L (ref 0.0–2.0)
Bicarbonate: 27.3 mEq/L — ABNORMAL HIGH (ref 20.0–24.0)
O2 SAT: 98 %
TCO2: 29 mmol/L (ref 0–100)
pCO2 arterial: 49 mmHg — ABNORMAL HIGH (ref 35.0–45.0)
pH, Arterial: 7.355 (ref 7.350–7.450)
pO2, Arterial: 107 mmHg — ABNORMAL HIGH (ref 80.0–100.0)

## 2013-08-11 LAB — POCT I-STAT 3, VENOUS BLOOD GAS (G3P V)
ACID-BASE EXCESS: 2 mmol/L (ref 0.0–2.0)
Acid-Base Excess: 2 mmol/L (ref 0.0–2.0)
BICARBONATE: 25.9 meq/L — AB (ref 20.0–24.0)
Bicarbonate: 28.9 mEq/L — ABNORMAL HIGH (ref 20.0–24.0)
Bicarbonate: 28.2 mEq/L — ABNORMAL HIGH (ref 20.0–24.0)
O2 Saturation: 73 %
O2 Saturation: 84 %
O2 Saturation: 70 %
PCO2 VEN: 54.2 mmHg — AB (ref 45.0–50.0)
PCO2 VEN: 46.8 mmHg (ref 45.0–50.0)
PCO2 VEN: 49.9 mmHg (ref 45.0–50.0)
PH VEN: 7.336 — AB (ref 7.250–7.300)
PH VEN: 7.36 — AB (ref 7.250–7.300)
PO2 VEN: 42 mmHg (ref 30.0–45.0)
TCO2: 31 mmol/L (ref 0–100)
TCO2: 27 mmol/L (ref 0–100)
TCO2: 30 mmol/L (ref 0–100)
pH, Ven: 7.352 — ABNORMAL HIGH (ref 7.250–7.300)
pO2, Ven: 51 mmHg — ABNORMAL HIGH (ref 30.0–45.0)
pO2, Ven: 39 mmHg (ref 30.0–45.0)

## 2013-08-11 LAB — POCT ACTIVATED CLOTTING TIME: Activated Clotting Time: 503 seconds

## 2013-08-11 LAB — LIPID PANEL
CHOL/HDL RATIO: 3 ratio
Cholesterol: 188 mg/dL (ref 0–200)
HDL: 62 mg/dL (ref >39)
LDL Cholesterol: 114 mg/dL — ABNORMAL HIGH (ref 0–99)
TRIGLYCERIDES: 58 mg/dL (ref <150)
VLDL: 12 mg/dL (ref 0–40)

## 2013-08-11 SURGERY — LEFT AND RIGHT HEART CATHETERIZATION WITH CORONARY ANGIOGRAM
Anesthesia: LOCAL

## 2013-08-11 MED ORDER — BIVALIRUDIN 250 MG IV SOLR
INTRAVENOUS | Status: AC
  Filled 2013-08-11: qty 250

## 2013-08-11 MED ORDER — MIDAZOLAM HCL 2 MG/2ML IJ SOLN
INTRAMUSCULAR | Status: AC
  Filled 2013-08-11: qty 2

## 2013-08-11 MED ORDER — VERAPAMIL HCL 2.5 MG/ML IV SOLN
INTRAVENOUS | Status: AC
Start: 2013-08-11 — End: 2013-08-11
  Filled 2013-08-11: qty 2

## 2013-08-11 MED ORDER — HEPARIN (PORCINE) IN NACL 2-0.9 UNIT/ML-% IJ SOLN
INTRAMUSCULAR | Status: AC
  Filled 2013-08-11: qty 1000

## 2013-08-11 MED ORDER — ASPIRIN 81 MG PO CHEW
81.0000 mg | CHEWABLE_TABLET | ORAL | Status: AC
  Administered 2013-08-11: 81 mg via ORAL
  Filled 2013-08-11: qty 1

## 2013-08-11 MED ORDER — TICAGRELOR 90 MG PO TABS
ORAL_TABLET | ORAL | Status: AC
  Filled 2013-08-11: qty 1

## 2013-08-11 MED ORDER — METOPROLOL SUCCINATE ER 25 MG PO TB24
25.0000 mg | ORAL_TABLET | Freq: Every day | ORAL | Status: DC
  Administered 2013-08-11: 16:00:00 25 mg via ORAL
  Filled 2013-08-11 (×2): qty 1

## 2013-08-11 MED ORDER — ASPIRIN 81 MG PO CHEW
81.0000 mg | CHEWABLE_TABLET | Freq: Every day | ORAL | Status: DC
  Administered 2013-08-12: 81 mg via ORAL
  Filled 2013-08-11: qty 1

## 2013-08-11 MED ORDER — ATORVASTATIN CALCIUM 80 MG PO TABS
80.0000 mg | ORAL_TABLET | Freq: Every day | ORAL | Status: DC
  Administered 2013-08-11: 80 mg via ORAL
  Filled 2013-08-11 (×2): qty 1

## 2013-08-11 MED ORDER — ASPIRIN 81 MG PO CHEW
CHEWABLE_TABLET | ORAL | Status: AC
  Filled 2013-08-11: qty 1

## 2013-08-11 MED ORDER — SODIUM CHLORIDE 0.9 % IV SOLN
1.0000 mL/kg/h | INTRAVENOUS | Status: AC
  Administered 2013-08-11: 1 mL/kg/h via INTRAVENOUS

## 2013-08-11 MED ORDER — SODIUM CHLORIDE 0.9 % IJ SOLN: 3.0000 mL | Freq: Two times a day (BID) | INTRAMUSCULAR | Status: DC

## 2013-08-11 MED ORDER — NITROGLYCERIN 0.2 MG/ML ON CALL CATH LAB
INTRAVENOUS | Status: AC
  Filled 2013-08-11: qty 1

## 2013-08-11 MED ORDER — SODIUM CHLORIDE 0.9 % IJ SOLN: 3.0000 mL | INTRAMUSCULAR | Status: DC | PRN

## 2013-08-11 MED ORDER — SODIUM CHLORIDE 0.9 % IV SOLN
INTRAVENOUS | Status: DC
  Administered 2013-08-11: 07:00:00 via INTRAVENOUS

## 2013-08-11 MED ORDER — HYDROMORPHONE HCL PF 1 MG/ML IJ SOLN
INTRAMUSCULAR | Status: AC
  Filled 2013-08-11: qty 1

## 2013-08-11 MED ORDER — SODIUM CHLORIDE 0.9 % IV SOLN: 250.0000 mL | INTRAVENOUS | Status: DC | PRN

## 2013-08-11 MED ORDER — TICAGRELOR 90 MG PO TABS
90.0000 mg | ORAL_TABLET | Freq: Two times a day (BID) | ORAL | Status: DC
  Administered 2013-08-11 – 2013-08-12 (×2): 90 mg via ORAL
  Filled 2013-08-11 (×3): qty 1

## 2013-08-11 MED ORDER — LIDOCAINE HCL (PF) 1 % IJ SOLN
INTRAMUSCULAR | Status: AC
  Filled 2013-08-11: qty 30

## 2013-08-11 MED ORDER — NITROGLYCERIN 0.4 MG SL SUBL: 0.4000 mg | SUBLINGUAL_TABLET | SUBLINGUAL | Status: DC | PRN

## 2013-08-11 MED ORDER — HEPARIN SODIUM (PORCINE) 1000 UNIT/ML IJ SOLN
INTRAMUSCULAR | Status: AC
  Filled 2013-08-11: qty 1

## 2013-08-11 NOTE — Progress Notes (Signed)
Chaplain followed up on consult by visiting pt. Pt expressed the desire for a someone from her home church, [employer],to come and pray with her.     08/11/13 1500  Clinical Encounter Type  Visited With Patient  Visit Type Spiritual support  Referral From Nurse  Spiritual Encounters  Spiritual Needs Prayer    Estelle June, chaplain pager [phone]

## 2013-08-11 NOTE — CV Procedure (Signed)
Procedure performed:  Right heart catheterization and calculation of cardiac output and cardiac index by Fick, Left heart catheterization including hemodynamic monitoring of the left ventricle, LV gram. Selective right and left coronary arteriography. PTCA and stenting of the proximal and mid LAD with overlapping 3.0 x 28 mm in the proximal to mid segment and the ostial 3.0 x 12 mm Promus Premier drug-eluting stent.  Indication: Patient is a 58 year-old female with recurrent chest pain, patient has been evaluated in the emergency room setting twice, presented with atypical chest pain, however relieved with nitroglycerin. She did undergone outpatient testing which was high risk revealing antral wall ischemia. Although not on any medication, given her risk stress test, classic symptoms of angina pectoris, patient  is brought to the cardiac catheterization lab to evaluate  coronary anatomy for definitive diagnosis of CAD. Outpatient echocardiogram performed at [facility] had revealed RV dilatation. To evaluate for pulmonary hypertension right heart catheterization was performed.  Hemodynamic data: Left ventricular pressure was 103/5 with LVEDP of 13 mm mercury. Aortic pressure was 103/69 with a mean of 84 mm mercury. There was no pressure gradient across the aortic valve.   Left ventricle: Performed in the RAO projection revealed LVEF of 60%. There was No MR. No wall motion abnormality.  Right coronary artery: The vessel is smooth, Dominant. There is mild luminal irregularity, smooth tan to 20% mid and about a 30% stenosis at the crux noted. Small PDA and PL branches.  Left main coronary artery is large and normal.  Circumflex coronary artery: A large vessel giving origin to a moderate sized obtuse marginal 1, small OM 2. There is very mild luminal irregularity in the mid to distal segment.   LAD:  LAD is a large vessel, giving origin to several small diagonals. The ostium has a 70-80% stenosis  followed by a mid 99% what appears to be a dissection, complex stenosis.  Ramus intermediate: Large caliber vessel. The proximal and midsegment of the ramus intermediate shows a 50 for most 60% stenosis. Mid to distal segment is smooth and normal.  Impression: Right heart catheterization, there is suggestion of atrial level shunting, mixed venous saturation 76%, right atrial saturation 84%. Cardiac output elevated at 10.66 cardiac index of 5.25. Consider TEE to further delineate cardiac anatomy.  Severe single-vessel coronary artery disease involving the ostium and proximal and midsegment of the LAD. Moderate disease in the ramus intermediate which is a large vessel.  Interventional data: Successful PTCA and stenting  of the proximal and mid LAD with overlapping 3.0 x 28 mm in the proximal to mid segment and the ostial 3.0 x 12 mm Promus Premier drug-eluting stent.  Will need Dual antiplatelet therapy with BRILINTA and ASA 81 mg for at least one year.   Technique of diagnostic cardiac catheterization:  Under sterile precautions using a 6 French right radial  arterial access, a 6 French sheath was introduced into the right radial artery. A 5 [place] Tig 4 catheter was advanced into the ascending aorta selective  right coronary artery and left coronary artery was cannulated and angiography was performed in multiple views. The catheter was pulled back Out of the body over exchange length J-wire. Same Catheter was used to perform LV gram which was performed in RAO projection. Catheter exchanged out of the body over J-Wire. NO immediate complications noted. Hemostasis achieved with TR band. Patient tolerated the procedure well.   Procedures performed: Right heart catheterization and calculation of cardiac output and cardiac index by  Fick. Right antecubital vein access was utilized for performing the procedure.   A 5 French  sheath introduced into right antecubital  vein for access under fluroscopic  guidance.  A 5 French Swan-Ganz catheter was advanced with balloon inflated  under fluoroscopic guidance into first the right atrium followed by the right ventricle and into the pulmonary artery to pulmonary artery wedge position. Hemodynamics were obtained in a locations.  After hemodynamics were completed, samples were taken for SaO2% measurement to be used in Fick  Calculation of cardiac output and cardiac index. The catheter was then pulled back the balloon down and then completely out of the body.   Procedural data:  RA pressure 10/9  Mean 8 mm mercury. RA saturation 84% %. Mixed venous saturation 74%. RV pressure 29/6 and Right ventricular EDP 8 mm Hg. PA pressure 31/13 with a mean of 20  mm mercury. PA saturation of 73 %.  Pulmonary capillary wedge 12/9 with a mean of 7 mm Hg. Aortic saturation 98 %.  Cardiac output was 10.66 with cardiac index of 5.25  by Fick.   Technique of intervention:  Using a 6 [place] Ikari 3.5 guide catheter the LM  coronary  was selected and cannulated. Using Angiomax for anticoagulation, I utilized a Couger guidewire and across the LAD coronary artery without any difficulty. I placed the tip of the wire into the distal  coronary artery. Angiography was performed.   Then I utilized a 3.0x15 mm Angioscuplt balloon , I performed balloon angioplasty at 10 atmospheric pressure x 2 for 60 seconds each.  I proceeded with implantation of a 3.0 x 28 mm Promus Premier  drug-eluting stent into the proximal and mid LAD coronary artery. The stent was deployed at 12 atmospheric pressure for 45 seconds, and the same stent balloon was gently pulled back into the ostium of the LAD and a second inflation at 16 atmospheric pressure was performed keeping the balloon at within the stent segment in the distal and for 40 seconds. This was followed by angiography. The ostium needed a second stent, I utilized a 3.0 x 12 mm Promus Premier drug-eluting stent which was deployed into the ostium  carefully B. Wingate under multiple views at 12 atmospheric pressure for 50 seconds followed by gently pushing the same stent balloon between the previously placed stent and the new stent and 16 atmospheric balloon inflation was performed for 35 seconds. Also the same stent balloon was withdrawn into the left main and 16 atmospheric pressure inflation for 20 seconds was performed. Intracoronary nitroglycerin was administered and angiography was performed. Excellent result was evident without any evidence of edge dissection. TIMI-3 flow was maintained. The guidewire and the balloon was withdrawn out of the body and angiography was performed and the guide catheter disengaged and pulled out of the body over J-wire. Patient tolerated the procedure well. There was no immediate competitions. Hemostasis was obtained by applying TR band. Right brachial/antecubital vein access was sutured in place.  Disposition: Patient will be discharged in morning unless complications with out-patient follow up.

## 2013-08-11 NOTE — Interval H&P Note (Signed)
History and Physical Interval Note:  08/11/2013 8:09 AM  Emma Combs  has presented today for surgery, with the diagnosis of Chest pain  The various methods of treatment have been discussed with the patient and family. After consideration of risks, benefits and other options for treatment, the patient has consented to  Procedure(s): LEFT AND RIGHT HEART CATHETERIZATION WITH CORONARY ANGIOGRAM (N/A) possible PCI as a surgical intervention .  The patient's history has been reviewed, patient examined, no change in status, stable for surgery.  I have reviewed the patient's chart and labs.  Questions were answered to the patient's satisfaction.   Cath Lab Visit (complete for each Cath Lab visit)  Clinical Evaluation Leading to the Procedure:   ACS: no  Non-ACS:    Anginal Classification: CCS III  Anti-ischemic medical therapy: No Therapy  Non-Invasive Test Results: High-risk stress test findings: cardiac mortality >3%/year  Prior CABG: No previous CABG        [facility] R

## 2013-08-11 NOTE — H&P (Signed)
  Please see office visit notes for complete details of HPI.  

## 2013-08-12 DIAGNOSIS — I2510 Atherosclerotic heart disease of native coronary artery without angina pectoris: Secondary | ICD-10-CM

## 2013-08-12 LAB — CBC
HCT: 38.5 % (ref 36.0–46.0)
Hemoglobin: 13.1 g/dL (ref 12.0–15.0)
MCH: 30 pg (ref 26.0–34.0)
MCHC: 34 g/dL (ref 30.0–36.0)
MCV: 88.3 fL (ref 78.0–100.0)
Platelets: 127 10*3/uL — ABNORMAL LOW (ref 150–400)
RBC: 4.36 MIL/uL (ref 3.87–5.11)
RDW: 12.7 % (ref 11.5–15.5)
WBC: 4.4 10*3/uL (ref 4.0–10.5)

## 2013-08-12 LAB — BASIC METABOLIC PANEL
BUN: 11 mg/dL (ref 6–23)
CALCIUM: 9.5 mg/dL (ref 8.4–10.5)
CO2: 25 meq/L (ref 19–32)
Chloride: 102 mEq/L (ref 96–112)
Creatinine, Ser: 0.86 mg/dL (ref 0.50–1.10)
GFR calc Af Amer: 85 mL/min — ABNORMAL LOW (ref >90)
GFR calc non Af Amer: 74 mL/min — ABNORMAL LOW (ref >90)
GLUCOSE: 120 mg/dL — AB (ref 70–99)
Potassium: 4.2 mEq/L (ref 3.7–5.3)
Sodium: 138 mEq/L (ref 137–147)

## 2013-08-12 MED ORDER — ATORVASTATIN CALCIUM 80 MG PO TABS
80.0000 mg | ORAL_TABLET | Freq: Every day | ORAL | Status: DC
Start: 2013-08-12 — End: 2018-09-08

## 2013-08-12 MED ORDER — METOPROLOL SUCCINATE ER 50 MG PO TB24: 50.0000 mg | ORAL_TABLET | Freq: Every day | ORAL | Status: DC

## 2013-08-12 MED ORDER — TICAGRELOR 90 MG PO TABS: 90.0000 mg | ORAL_TABLET | Freq: Two times a day (BID) | ORAL | Status: DC

## 2013-08-12 MED ORDER — ASPIRIN 81 MG PO CHEW: 81.0000 mg | CHEWABLE_TABLET | Freq: Every day | ORAL | Status: AC

## 2013-08-12 MED FILL — Sodium Chloride IV Soln 0.9%: INTRAVENOUS | Qty: 50 | Status: AC

## 2013-08-12 NOTE — Progress Notes (Signed)
TR BAND REMOVAL  LOCATION:    right radial  DEFLATED PER PROTOCOL:    yes  TIME BAND OFF / DRESSING APPLIED:    1300   SITE UPON ARRIVAL:    Level 0  SITE AFTER BAND REMOVAL:    Level 0  REVERSE ALLEN'S TEST:     positive  CIRCULATION SENSATION AND MOVEMENT:    Within Normal Limits   yes  COMMENTS:   Gauze dressing applied secured with tegaderm at 1300. 1330 rechecked site assessment without change, dressing dry and intact, CSMs wnls, right radial and ulnar pulses +2.

## 2013-08-12 NOTE — Progress Notes (Signed)
  CARE MANAGEMENT NOTE 08/12/2013  Patient:  Emma Combs, Emma Combs   Account Number:  [id number]  Date Initiated:  08/12/2013  Documentation initiated by:  [facility]  Subjective/Objective Assessment:   58 yo female  diagnosis of Chest pain //Home with spouse     Action/Plan:   LEFT AND RIGHT HEART CATHETERIZATION WITH CORONARY ANGIOGRAM (N/A) possible PCI as a surgical intervention//Benefits check for Brilinta   Anticipated DC Date:  08/12/2013   Anticipated DC Plan:  [place]  CM consult      Choice offered to / List presented to:             Status of service:  In process, will continue to follow Medicare Important Message given?   (If response is "NO", the following Medicare IM given date fields will be blank) Date Medicare IM given:   Date Additional Medicare IM given:    Discharge Disposition:    Per UR Regulation:  Reviewed for med. necessity/level of care/duration of stay  If discussed at [place] of Stay Meetings, dates discussed:    Comments:  08/12/13 [place], [place], [place] Spoke with pt at bedside regarding benefits check for Brilinta.  Pt has brochure with 30 day free card and refill assistance card intact.  Pt utilizes [facility] on [address] for prescription needs.  NCM called pharmacy to confirm availability of medication. Information relayed to pt.  Pt verbalizes importance of filling medication upon discharge.

## 2013-08-12 NOTE — Discharge Summary (Signed)
Physician Discharge Summary  Patient ID: Emma Combs MRN: [id number] DOB/AGE: December 14, 1955 58 y.o.  Admit date: 08/11/2013 Discharge date: 08/12/2013  Primary Discharge Diagnosis CAD of the native coronary arteries S/P 08/12/2013: PTCA and stenting of the proximal and mid LAD with overlapping 3.0 x 28 mm in the proximal to mid segment and the ostial 3.0 x 12 mm Promus Premier drug-eluting stent. Secondary Discharge Diagnosis Hyperlipidemia Hypertension  Significant Diagnostic Studies: 08/11/2013: Heart catheterization: Hemodynamic data:  Left ventricular pressure was 103/5 with LVEDP of 13 mm mercury. Aortic pressure was 103/69 with a mean of 84 mm mercury. There was no pressure gradient across the aortic valve.  Left ventricle: Performed in the RAO projection revealed LVEF of 60%. There was No MR. No wall motion abnormality.  Right coronary artery: The vessel is smooth, Dominant. There is mild luminal irregularity, smooth tan to 20% mid and about a 30% stenosis at the crux noted. Small PDA and PL branches.  Left main coronary artery is large and normal.  Circumflex coronary artery: A large vessel giving origin to a moderate sized obtuse marginal 1, small OM 2. There is very mild luminal irregularity in the mid to distal segment.  LAD: LAD is a large vessel, giving origin to several small diagonals. The ostium has a 70-80% stenosis followed by a mid 99% what appears to be a dissection, complex stenosis.  Ramus intermediate: Large caliber vessel. The proximal and midsegment of the ramus intermediate shows a 50 for most 60% stenosis. Mid to distal segment is smooth and normal.  Impression: Right heart catheterization, there is suggestion of atrial level shunting, mixed venous saturation 76%, right atrial saturation 84%. Cardiac output elevated at 10.66 cardiac index of 5.25. Consider TEE to further delineate cardiac anatomy.  Severe single-vessel coronary artery disease involving the ostium and  proximal and midsegment of the LAD. Moderate disease in the ramus intermediate which is a large vessel.  Interventional data: Successful PTCA and stenting of the proximal and mid LAD with overlapping 3.0 x 28 mm in the proximal to mid segment and the ostial 3.0 x 12 mm Promus Premier drug-eluting stent. Will need Dual antiplatelet therapy with BRILINTA and ASA 81 mg for at least one year.   Procedural data:  RA pressure 10/9 Mean 8 mm mercury. RA saturation 84% %. Mixed venous saturation 74%.  RV pressure 29/6 and Right ventricular EDP 8 mm Hg.  PA pressure 31/13 with a mean of 20 mm mercury. PA saturation of 73 %.  Pulmonary capillary wedge 12/9 with a mean of 7 mm Hg. Aortic saturation 98 %.  Cardiac output was 10.66 with cardiac index of 5.25 by Fick.   Hospital Course: Patient admitted electively for coronary angiography due to markedly abnormal stress test and also ongoing chest discomfort. Due to dyspnea in also abnormal echocardiogram revealing right ventricular dilatation, right heart catheterization was also performed. After angioplasty, which is fairly complex, patient tolerated the procedure well, the next morning patient already felt well without any chest pain or shortness of breath and stated that her chest felt very light and she was ready for discharge. She'll be taken off of work for 10 days.  Recommendations on discharge: Resume activities slowly, not to return to work for 10 days.  Discharge Exam: Blood pressure 114/78, pulse 85, temperature 98.4 F (36.9 C), temperature source Oral, resp. rate 18, height 5\' 5"  (1.651 m), weight 94.9 kg (209 lb 3.5 oz), SpO2 99.00%.  General appearance: alert, cooperative, appears stated age and  no distress Resp: clear to auscultation bilaterally Cardio: regular rate and rhythm, S1, S2 normal, no murmur, click, rub or gallop Extremities: extremities normal, atraumatic, no cyanosis or edema Pulses: 2+ and symmetric Neurologic: Grossly  normal Labs:   Lab Results  Component Value Date   WBC 4.4 08/12/2013   HGB 13.1 08/12/2013   HCT 38.5 08/12/2013   MCV 88.3 08/12/2013   PLT 127* 08/12/2013    Recent Labs Lab 08/12/13 0333  NA 138  K 4.2  CL 102  CO2 25  BUN 11  CREATININE 0.86  CALCIUM 9.5  GLUCOSE 120*   No results found for this basename: CKTOTAL, CKMB, CKMBINDEX, TROPONINI    Lipid Panel     Component Value Date/Time   CHOL 188 08/11/2013 0950   TRIG 58 08/11/2013 0950   HDL 62 08/11/2013 0950   CHOLHDL 3.0 08/11/2013 0950   VLDL 12 08/11/2013 0950   LDLCALC 114* 08/11/2013 0950    EKG:  08/12/2013: Independently read. Sinus bradycardia at a rate of 56 beats a minute, normal axis, borderline criteria for LVH, nonspecific T wave inversion in V1 to V4, unchanged from prior EKG.    Radiology: Dg Chest 2 View  07/23/2013   CLINICAL DATA:  Left chest and arm pain.  Shortness of breath.  EXAM: CHEST  2 VIEW  COMPARISON:  08/24/2011  FINDINGS: Poor inspiration with no gross change in borderline enlargement of the cardiac silhouette. Clear lungs with normal vascularity. Interval mild diffuse peribronchial thickening. Unremarkable bones.  IMPRESSION: 1. Interval mild bronchitic changes. 2. Stable borderline cardiomegaly   Electronically Signed   By: Enrique Sack M.D.   On: 07/23/2013 11:38      FOLLOW UP PLANS AND APPOINTMENTS Discharge Orders   Future Orders Complete By Expires   Amb Referral to Cardiac Rehabilitation  As directed    Comments:     Pt agrees to [place]. CRP at [facility], will send referral.       Medication List    STOP taking these medications       aspirin EC 81 MG tablet  Replaced by:  aspirin 81 MG chewable tablet     OVER THE COUNTER MEDICATION     OVER THE COUNTER MEDICATION     OVER THE COUNTER MEDICATION      TAKE these medications       ALOE VERA PO  Take 8 oz by mouth daily.     aspirin 81 MG chewable tablet  Chew 1 tablet (81 mg total) by mouth daily.      atorvastatin 80 MG tablet  Commonly known as:  LIPITOR  Take 1 tablet (80 mg total) by mouth daily at 6 PM.     BEE PROPOLIS PO  Take 2 capsules by mouth daily.     Fiber Powd  Take 1 packet by mouth daily.     metoprolol succinate 50 MG 24 hr tablet  Commonly known as:  TOPROL-XL  Take 1 tablet (50 mg total) by mouth daily.     multivitamin with minerals Tabs tablet  Take 2 tablets by mouth daily.     nitroGLYCERIN 0.4 MG SL tablet  Commonly known as:  NITROSTAT  Place 0.4 mg under the tongue every 5 (five) minutes as needed for chest pain.     OMEGA 3 PO  Take 2 capsules by mouth daily with breakfast.     Ticagrelor 90 MG Tabs tablet  Commonly known as:  [employer]  Take 1 tablet (90 mg total) by mouth 2 (two) times daily.     VITALIZE PO  Take 2 tablets by mouth daily.           Follow-up Information   Follow up with Woody Seller Sylvan Cheese, MD. (Keep previous appointment)    Specialty:  Cardiology   Contact information:   [facility], [address] [place] [address] [phone]        Laverda Page, MD 08/12/2013, 9:04 AM  Pager: [phone] Office: [phone] If no answer: [phone]

## 2013-08-12 NOTE — Discharge Instructions (Addendum)
Angina Pectoris Angina pectoris, often just called angina, is extreme discomfort in your chest, neck, or arm caused by a lack of blood in the middle and thickest layer of your heart wall (myocardium). It may feel like tightness or heavy pressure. It may feel like a crushing or squeezing pain. Some people say it feels like gas or indigestion. It may go down your shoulders, back, and arms. Some people may have symptoms other than pain. These symptoms include fatigue, shortness of breath, cold sweats, or nausea. There are four different types of angina:  Stable angina Stable angina usually occurs in episodes of predictable frequency and duration. It usually is brought on by physical activity, emotional stress, or excitement. These are all times when the myocardium needs more oxygen. Stable angina usually lasts a few minutes and often is relieved by taking a medicine that can be taken under your tongue (sublingually). The medicine is called nitroglycerin. Stable angina is caused by a buildup of plaque inside the arteries, which restricts blood flow to the heart muscle (atherosclerosis).  Unstable angina Unstable angina can occur even when your body experiences little or no physical exertion. It can occur during sleep. It can also occur at rest. It can suddenly increase in severity or frequency. It might not be relieved by sublingual nitroglycerin. It can last up to 30 minutes. The most common cause of unstable angina is a blood clot that has developed on the top of plaque buildup inside a coronary artery. It can lead to a heart attack if the blood clot completely blocks the artery.  Microvascular angina This type of angina is caused by a disorder of tiny blood vessels called arterioles. Microvascular angina is more common in women. The pain may be more severe and last longer than other types of angina pectoris.  Prinzmetal or variant angina This type of angina pectoris usually occurs when your body experiences  little or no physical exertion. It especially occurs in the early morning hours. It is caused by a spasm of your coronary artery. HOME CARE INSTRUCTIONS   Only take over-the-counter and prescription medicines as directed by your caregiver.  Stay active or increase your exercise as directed by your caregiver.  Limit strenuous activity as directed by your caregiver.  Limit heavy lifting as directed by your caregiver.  Maintain a healthy weight.  Learn about and eat heart-healthy foods.  Do not smoke. SEEK IMMEDIATE MEDICAL CARE IF:  You experience the following symptoms:  Chest, neck, deep shoulder, or arm pain or discomfort that lasts more than a few minutes.  Chest, neck, deep shoulder, or arm pain or discomfort that goes away and comes back, repeatedly.  Heavy sweating with discomfort, without a noticeable cause.  Shortness of breath or difficulty breathing.  Angina that does not get better after a few minutes of rest or after taking sublingual nitroglycerin. These can all be symptoms of a heart attack, which is a medical emergency! Get medical help at once. Call your local emergency service (911 in [place] immediately. Do not  drive yourself to the hospital and do not  wait to for your symptoms to go away. MAKE SURE YOU:  Understand these instructions.  Will watch your condition.  Will get help right away if you are not doing well or get worse. Document Released: 06/02/2005 Document Revised: 05/19/2012 Document Reviewed: 03/11/2012 [facility] Patient Information 2014 [place], [place].  Angina Pectoris Angina pectoris, often just called angina, is extreme discomfort in your chest, neck, or arm caused  by a lack of blood in the middle and thickest layer of your heart wall (myocardium). It may feel like tightness or heavy pressure. It may feel like a crushing or squeezing pain. Some people say it feels like gas or indigestion. It may go down your shoulders, back, and arms. Some  people may have symptoms other than pain. These symptoms include fatigue, shortness of breath, cold sweats, or nausea. There are four different types of angina:  Stable angina Stable angina usually occurs in episodes of predictable frequency and duration. It usually is brought on by physical activity, emotional stress, or excitement. These are all times when the myocardium needs more oxygen. Stable angina usually lasts a few minutes and often is relieved by taking a medicine that can be taken under your tongue (sublingually). The medicine is called nitroglycerin. Stable angina is caused by a buildup of plaque inside the arteries, which restricts blood flow to the heart muscle (atherosclerosis).  Unstable angina Unstable angina can occur even when your body experiences little or no physical exertion. It can occur during sleep. It can also occur at rest. It can suddenly increase in severity or frequency. It might not be relieved by sublingual nitroglycerin. It can last up to 30 minutes. The most common cause of unstable angina is a blood clot that has developed on the top of plaque buildup inside a coronary artery. It can lead to a heart attack if the blood clot completely blocks the artery.  Microvascular angina This type of angina is caused by a disorder of tiny blood vessels called arterioles. Microvascular angina is more common in women. The pain may be more severe and last longer than other types of angina pectoris.  Prinzmetal or variant angina This type of angina pectoris usually occurs when your body experiences little or no physical exertion. It especially occurs in the early morning hours. It is caused by a spasm of your coronary artery. HOME CARE INSTRUCTIONS   Only take over-the-counter and prescription medicines as directed by your caregiver.  Stay active or increase your exercise as directed by your caregiver.  Limit strenuous activity as directed by your caregiver.  Limit heavy lifting  as directed by your caregiver.  Maintain a healthy weight.  Learn about and eat heart-healthy foods.  Do not smoke. SEEK IMMEDIATE MEDICAL CARE IF:  You experience the following symptoms:  Chest, neck, deep shoulder, or arm pain or discomfort that lasts more than a few minutes.  Chest, neck, deep shoulder, or arm pain or discomfort that goes away and comes back, repeatedly.  Heavy sweating with discomfort, without a noticeable cause.  Shortness of breath or difficulty breathing.  Angina that does not get better after a few minutes of rest or after taking sublingual nitroglycerin. These can all be symptoms of a heart attack, which is a medical emergency! Get medical help at once. Call your local emergency service (911 in [place] immediately. Do not  drive yourself to the hospital and do not  wait to for your symptoms to go away. MAKE SURE YOU:  Understand these instructions.  Will watch your condition.  Will get help right away if you are not doing well or get worse. Document Released: 06/02/2005 Document Revised: 05/19/2012 Document Reviewed: 03/11/2012 [facility] Patient Information 2014 [place], [place].  Aspirin and Your Heart Aspirin affects the way your blood clots and helps "thin" the blood. Aspirin has many uses in heart disease. It may be used as a primary prevention to help reduce the  risk of heart related events. It also can be used as a secondary measure to prevent more heart attacks or to prevent additional damage from blood clots.  ASPIRIN MAY HELP IF YOU:  Have had a heart attack or chest pain.  Have undergone open heart surgery such as CABG (Coronary Artery Bypass Surgery).  Have had coronary angioplasty with or without stents.  Have experienced a stroke or TIA (transient ischemic attack).  Have peripheral vascular disease (PAD).  Have chronic heart rhythm problems such as atrial fibrillation.  Are at risk for heart disease. BEFORE STARTING ASPIRIN Before you  start taking aspirin, your caregiver will need to review your medical history. Many things will need to be taken into consideration, such as:  Smoking status.  Blood pressure.  Diabetes.  Gender.  Weight.  Cholesterol level. ASPIRIN DOSES  Aspirin should only be taken on the advice of your caregiver. Talk to your caregiver about how much aspirin you should take. Aspirin comes in different doses such as:  81 mg.  162 mg.  325 mg.  The aspirin dose you take may be affected by many factors, some of which include:  Your current medications, especially if your are taking blood-thinners or anti-platelet medicine.  Liver function.  Heart disease risk.  Age.  Aspirin comes in two forms:  Non-enteric-coated. This type of aspirin does not have a coating and is absorbed faster. Non-enteric coated aspirin is recommended for patients experiencing chest pain symptoms. This type of aspirin also comes in a chewable form.  Enteric-coated. This means the aspirin has a special coating that releases the medicine very slowly. Enteric-coated aspirin causes less stomach upset. This type of aspirin should not be chewed or crushed. ASPIRIN SIDE EFFECTS Daily use of aspirin can increase your risk of serious side effects. Some of these include:  Increased bleeding. This can range from a cut that does not stop bleeding to more serious problems such as stomach bleeding or bleeding into the brain (Intracerebral bleeding).  Increased bruising.  Stomach upset.  An allergic reaction such as red, itchy skin.  Increased risk of bleeding when combined with non-steroidal anti-inflammatory medicine (NSAIDS).  Alcohol should be drank in moderation when taking aspirin. Alcohol can increase the risk of stomach bleeding when taken with aspirin.  Aspirin should not be given to children less than 55 years of age due to the association of Reye syndrome. Reye syndrome is a serious illness that can affect the  brain and liver. Studies have linked Reye syndrome with aspirin use in children.  People that have nasal polyps have an increased risk of developing an aspirin allergy. SEEK MEDICAL CARE IF:   You develop an allergic reaction such as:  Hives.  Itchy skin.  Swelling of the lips, tongue or face.  You develop stomach pain.  You have unusual bleeding or bruising.  You have ringing in your ears. SEEK IMMEDIATE MEDICAL CARE IF:   You have severe chest pain, especially if the pain is crushing or pressure-like and spreads to the arms, back, neck, or jaw. THIS IS AN EMERGENCY. Do not wait to see if the pain will go away. Get medical help at once. Call your local emergency services (911 in the [place] DO NOT drive yourself to the hospital.  You have stroke-like symptoms such as:  Loss of vision.  Difficulty talking.  Numbness or weakness on one side of your body.  Numbness or weakness in your arm or leg.  Not thinking clearly or feeling  confused.  Your bowel movements are bloody, dark red or black in color.  You vomit or cough up blood.  You have blood in your urine.  You have shortness of breath, coughing or wheezing. MAKE SURE YOU:   Understand these instructions.  Will monitor your condition.  Seek immediate medical care if necessary. Document Released: 05/15/2008 Document Revised: 09/27/2012 Document Reviewed: 05/15/2008 [facility] Patient Information 2014 [place].

## 2013-08-12 NOTE — Progress Notes (Signed)
CARDIAC REHAB PHASE I   PRE:  Rate/Rhythm: 91 SR  BP:  Supine: 156/79  Sitting:   Standing:    SaO2:   MODE:  Ambulation: 1000 ft   POST:  Rate/Rhythm: 96 SR  BP:  Supine:   Sitting: 148/95  Standing:    SaO2:  0735-0850 Pt tolerated ambulation well without c/o of cp or SOB. Pt to bed after walk with call light in reach. Completed stent discharge education with pt. She voices understanding. Pt agrees to Outpt CRP at [facility], will send referral.  Rodney Langton RN 08/12/2013 8:49 AM

## 2013-08-12 NOTE — Progress Notes (Signed)
Right brachial venous cath site level 0 with dressing dry and intact at 1200. Sheath pulled at 1200 and pressure held. Site observed with no bleeding and remains level 0, Gauze dressing applied secured with tegaderm at 1215. And patient instructed to report any changes.

## 2013-08-30 ENCOUNTER — Encounter: Payer: Self-pay | Admitting: Family Medicine

## 2013-09-06 ENCOUNTER — Encounter (HOSPITAL_COMMUNITY): Payer: Self-pay | Admitting: *Deleted

## 2013-09-06 ENCOUNTER — Ambulatory Visit (HOSPITAL_COMMUNITY)
Admission: RE | Admit: 2013-09-06 | Discharge: 2013-09-06 | Disposition: A | Discharge status: Home / Self Care | Payer: BC Managed Care – PPO | Source: Ambulatory Visit | Attending: Cardiology | Admitting: Cardiology

## 2013-09-06 ENCOUNTER — Encounter (HOSPITAL_COMMUNITY)
Admission: RE | Disposition: A | Discharge status: Home / Self Care | Payer: Self-pay | Source: Ambulatory Visit | Attending: Cardiology

## 2013-09-06 DIAGNOSIS — Q211 Atrial septal defect: Secondary | ICD-10-CM | POA: Insufficient documentation

## 2013-09-06 DIAGNOSIS — Q2111 Secundum atrial septal defect: Secondary | ICD-10-CM | POA: Insufficient documentation

## 2013-09-06 HISTORY — PX: TEE WITHOUT CARDIOVERSION: SHX5443

## 2013-09-06 SURGERY — ECHOCARDIOGRAM, TRANSESOPHAGEAL
Anesthesia: Moderate Sedation

## 2013-09-06 MED ORDER — BUTAMBEN-TETRACAINE-BENZOCAINE 2-2-14 % EX AERO
INHALATION_SPRAY | CUTANEOUS | Status: DC | PRN
  Administered 2013-09-06: 2 via TOPICAL

## 2013-09-06 MED ORDER — FENTANYL CITRATE 0.05 MG/ML IJ SOLN
INTRAMUSCULAR | Status: DC | PRN
  Administered 2013-09-06 (×2): 25 ug via INTRAVENOUS

## 2013-09-06 MED ORDER — MIDAZOLAM HCL 5 MG/ML IJ SOLN
INTRAMUSCULAR | Status: AC
  Filled 2013-09-06: qty 2

## 2013-09-06 MED ORDER — SODIUM CHLORIDE 0.9 % IV SOLN
INTRAVENOUS | Status: DC
  Administered 2013-09-06: 500 mL via INTRAVENOUS

## 2013-09-06 MED ORDER — FENTANYL CITRATE 0.05 MG/ML IJ SOLN
INTRAMUSCULAR | Status: AC
  Filled 2013-09-06: qty 2

## 2013-09-06 MED ORDER — MIDAZOLAM HCL 10 MG/2ML IJ SOLN
INTRAMUSCULAR | Status: DC | PRN
  Administered 2013-09-06: 2 mg via INTRAVENOUS

## 2013-09-06 NOTE — Interval H&P Note (Signed)
History and Physical Interval Note:  09/06/2013 10:25 AM  Emma Combs  has presented today for surgery, with the diagnosis of ASD  The various methods of treatment have been discussed with the patient and family. After consideration of risks, benefits and other options for treatment, the patient has consented to  Procedure(s): TRANSESOPHAGEAL ECHOCARDIOGRAM (TEE) (N/A) as a surgical intervention .  The patient's history has been reviewed, patient examined, no change in status, stable for surgery.  I have reviewed the patient's chart and labs.  Questions were answered to the patient's satisfaction.    Patient has had right heart catheterization which had revealed significant intracardiac shunting suggestive of atrial level shunting. Hence patient is set up for a outpatient TEE to better evaluate cardiac anatomy.   Laverda Page

## 2013-09-06 NOTE — Progress Notes (Signed)
*  PRELIMINARY RESULTS* Echocardiogram TEE has been performed.  Leavy Cella 09/06/2013, 11:59 AM

## 2013-09-06 NOTE — H&P (View-Only) (Signed)
  Please see office visit notes for complete details of HPI.  

## 2013-09-06 NOTE — Discharge Instructions (Signed)
Transesophageal Echocardiography °Transesophageal echocardiography (TEE) is a picture test of your heart using sound waves. The pictures taken can give very detailed pictures of your heart. This can help your doctor see if there are problems with your heart. TEE can check: °· If your heart has blood clots in it. °· How well your heart valves are working. °· If you have an infection on the inside of your heart. °· Some of the major arteries of your heart. °· If your heart valve is working after a repair. °· Your heart before a procedure that uses a shock to your heart to get the rhythm back to normal. °BEFORE THE PROCEDURE °· Do not eat or drink for 6 hours before the procedure or as told by your doctor. °· Make plans to have someone drive you home after the procedure. Do not drive yourself home. °· An IV tube will be put in your arm. °PROCEDURE °· You will be given a medicine to help you relax (sedative). It will be given through the IV tube. °· A numbing medicine will be sprayed in the back of your throat to help numb it. °· The tip of the probe is placed into the back of your mouth. You will be asked to swallow. This helps to pass the probe into your esophagus. °· Once the tip of the probe is in the right place, your doctor can take pictures of your heart. °· You may feel pressure at the back of your throat. °AFTER THE PROCEDURE °· You will be taken to a recovery area so the sedative can wear off. °· Your throat may be sore and scratchy. This will go away slowly over time. °· You will go home when you are fully awake and able to swallow liquids. °· You should have someone stay with you for the next 24 hours. °Document Released: 03/30/2009 Document Revised: 03/23/2013 Document Reviewed: 12/02/2012 °[employer]® Patient Information ©2014 [employer]. ° °

## 2013-09-06 NOTE — CV Procedure (Signed)
TEE: Under moderate sedation, TEE was performed without complications: LV: Normal. Normal EF. RV: Normal LA: Normal. Left atrial appendage: Normal without thrombus. Normal function. Inter atrial septum is intact without defect. Double contrast study negative for atrial level shunting. No late appearance of bubbles either. RA: Normal  MV: Normal Trace MR. TV: Normal Mild TR. No pulmonary hypertension AV: Normal. No AI or AS. PV: Normal. Trace PI. Interatrial septum: Intact. No negative or positive contrast effect noted. No atrial level shunting.  Thoracic and ascending aorta: Normal without significant plaque or atheromatous changes.

## 2013-09-07 ENCOUNTER — Encounter (HOSPITAL_COMMUNITY): Payer: Self-pay | Admitting: Cardiology

## 2014-02-17 ENCOUNTER — Observation Stay: Payer: Self-pay | Admitting: Internal Medicine

## 2014-02-17 LAB — COMPREHENSIVE METABOLIC PANEL
ALT: 30 U/L
AST: 26 U/L (ref 15–37)
Albumin: 4.1 g/dL (ref 3.4–5.0)
Alkaline Phosphatase: 64 U/L
Anion Gap: 6 — ABNORMAL LOW (ref 7–16)
BILIRUBIN TOTAL: 0.6 mg/dL (ref 0.2–1.0)
BUN: 10 mg/dL (ref 7–18)
Calcium, Total: 9.3 mg/dL (ref 8.5–10.1)
Chloride: 109 mmol/L — ABNORMAL HIGH (ref 98–107)
Co2: 25 mmol/L (ref 21–32)
Creatinine: 0.76 mg/dL (ref 0.60–1.30)
EGFR (African American): >60
EGFR (Non-African Amer.): >60
Glucose: 97 mg/dL (ref 65–99)
Osmolality: 278 (ref 275–301)
POTASSIUM: 3.9 mmol/L (ref 3.5–5.1)
SODIUM: 140 mmol/L (ref 136–145)
Total Protein: 8.1 g/dL (ref 6.4–8.2)

## 2014-02-17 LAB — URINALYSIS, COMPLETE
BACTERIA: NONE SEEN
Bilirubin,UR: NEGATIVE
Blood: NEGATIVE
Glucose,UR: NEGATIVE mg/dL (ref 0–75)
Ketone: NEGATIVE
Leukocyte Esterase: NEGATIVE
NITRITE: NEGATIVE
PH: 9 (ref 4.5–8.0)
Protein: NEGATIVE
RBC,UR: <1 /HPF (ref 0–5)
SPECIFIC GRAVITY: 1.004 (ref 1.003–1.030)
Squamous Epithelial: 1
WBC UR: 2 /HPF (ref 0–5)

## 2014-02-17 LAB — APTT: ACTIVATED PTT: 28.5 s (ref 23.6–35.9)

## 2014-02-17 LAB — CK-MB
CK-MB: 0.6 ng/mL (ref 0.5–3.6)
CK-MB: 0.7 ng/mL (ref 0.5–3.6)

## 2014-02-17 LAB — CBC
HCT: 41.1 % (ref 35.0–47.0)
HGB: 13.9 g/dL (ref 12.0–16.0)
MCH: 30.9 pg (ref 26.0–34.0)
MCHC: 33.8 g/dL (ref 32.0–36.0)
MCV: 91 fL (ref 80–100)
PLATELETS: 132 10*3/uL — AB (ref 150–440)
RBC: 4.5 10*6/uL (ref 3.80–5.20)
RDW: 13.7 % (ref 11.5–14.5)
WBC: 4.2 10*3/uL (ref 3.6–11.0)

## 2014-02-17 LAB — PROTIME-INR
INR: 1.1
PROTHROMBIN TIME: 13.9 s (ref 11.5–14.7)

## 2014-02-17 LAB — HCG, QUANTITATIVE, PREGNANCY: Beta Hcg, Quant.: <1 m[IU]/mL — ABNORMAL LOW

## 2014-02-17 LAB — TROPONIN I: Troponin-I: <0.02 ng/mL

## 2014-02-18 LAB — CBC WITH DIFFERENTIAL/PLATELET
BASOS ABS: 0 10*3/uL (ref 0.0–0.1)
Basophil %: 0.2 %
EOS PCT: 0.9 %
Eosinophil #: 0 10*3/uL (ref 0.0–0.7)
HCT: 38.8 % (ref 35.0–47.0)
HGB: 12.9 g/dL (ref 12.0–16.0)
LYMPHS ABS: 2.1 10*3/uL (ref 1.0–3.6)
Lymphocyte %: 48.7 %
MCH: 30.6 pg (ref 26.0–34.0)
MCHC: 33.2 g/dL (ref 32.0–36.0)
MCV: 92 fL (ref 80–100)
Monocyte #: 0.3 x10 3/mm (ref 0.2–0.9)
Monocyte %: 7.4 %
Neutrophil #: 1.9 10*3/uL (ref 1.4–6.5)
Neutrophil %: 42.8 %
PLATELETS: 124 10*3/uL — AB (ref 150–440)
RBC: 4.21 10*6/uL (ref 3.80–5.20)
RDW: 13.7 % (ref 11.5–14.5)
WBC: 4.3 10*3/uL (ref 3.6–11.0)

## 2014-02-18 LAB — BASIC METABOLIC PANEL
ANION GAP: 7 (ref 7–16)
BUN: 12 mg/dL (ref 7–18)
Calcium, Total: 8.2 mg/dL — ABNORMAL LOW (ref 8.5–10.1)
Chloride: 107 mmol/L (ref 98–107)
Co2: 27 mmol/L (ref 21–32)
Creatinine: 0.73 mg/dL (ref 0.60–1.30)
EGFR (African American): >60
EGFR (Non-African Amer.): >60
Glucose: 101 mg/dL — ABNORMAL HIGH (ref 65–99)
OSMOLALITY: 281 (ref 275–301)
Potassium: 3.8 mmol/L (ref 3.5–5.1)
Sodium: 141 mmol/L (ref 136–145)

## 2014-02-18 LAB — CK-MB: CK-MB: 0.9 ng/mL (ref 0.5–3.6)

## 2014-02-18 LAB — TROPONIN I

## 2014-02-22 ENCOUNTER — Encounter (HOSPITAL_COMMUNITY): Payer: Self-pay | Admitting: Pharmacy Technician

## 2014-02-28 ENCOUNTER — Ambulatory Visit (HOSPITAL_COMMUNITY)
Admission: RE | Admit: 2014-02-28 | Discharge: 2014-02-28 | Disposition: A | Discharge status: Home / Self Care | Payer: BC Managed Care – PPO | Source: Ambulatory Visit | Attending: Cardiology | Admitting: Cardiology

## 2014-02-28 ENCOUNTER — Encounter (HOSPITAL_COMMUNITY)
Admission: RE | Disposition: A | Discharge status: Home / Self Care | Payer: BC Managed Care – PPO | Source: Ambulatory Visit | Attending: Cardiology

## 2014-02-28 ENCOUNTER — Encounter (HOSPITAL_COMMUNITY): Payer: Self-pay | Admitting: Cardiology

## 2014-02-28 DIAGNOSIS — I2510 Atherosclerotic heart disease of native coronary artery without angina pectoris: Secondary | ICD-10-CM | POA: Diagnosis present

## 2014-02-28 DIAGNOSIS — R55 Syncope and collapse: Secondary | ICD-10-CM | POA: Insufficient documentation

## 2014-02-28 DIAGNOSIS — I200 Unstable angina: Secondary | ICD-10-CM | POA: Diagnosis not present

## 2014-02-28 DIAGNOSIS — Z9861 Coronary angioplasty status: Secondary | ICD-10-CM

## 2014-02-28 HISTORY — DX: Pure hypercholesterolemia, unspecified: E78.00

## 2014-02-28 HISTORY — DX: Angina pectoris, unspecified: I20.9

## 2014-02-28 HISTORY — DX: Atherosclerotic heart disease of native coronary artery without angina pectoris: I25.10

## 2014-02-28 HISTORY — DX: Unspecified osteoarthritis, unspecified site: M19.90

## 2014-02-28 HISTORY — DX: Leiomyoma of uterus, unspecified: D25.9

## 2014-02-28 HISTORY — PX: LEFT HEART CATHETERIZATION WITH CORONARY ANGIOGRAM: SHX5451

## 2014-02-28 HISTORY — PX: PERCUTANEOUS CORONARY STENT INTERVENTION (PCI-S): SHX5485

## 2014-02-28 LAB — POCT ACTIVATED CLOTTING TIME: Activated Clotting Time: 506 seconds

## 2014-02-28 SURGERY — LEFT HEART CATHETERIZATION WITH CORONARY ANGIOGRAM
Anesthesia: LOCAL

## 2014-02-28 MED ORDER — SODIUM CHLORIDE 0.9 % IV SOLN
1.0000 mL/kg/h | INTRAVENOUS | Status: DC
  Administered 2014-02-28: 10:00:00 1 mL/kg/h via INTRAVENOUS

## 2014-02-28 MED ORDER — BIVALIRUDIN 250 MG IV SOLR
INTRAVENOUS | Status: AC
  Filled 2014-02-28: qty 250

## 2014-02-28 MED ORDER — SODIUM CHLORIDE 0.9 % IJ SOLN: 3.0000 mL | Freq: Two times a day (BID) | INTRAMUSCULAR | Status: DC

## 2014-02-28 MED ORDER — SODIUM CHLORIDE 0.9 % IV SOLN
INTRAVENOUS | Status: DC
  Administered 2014-02-28: 08:00:00 via INTRAVENOUS

## 2014-02-28 MED ORDER — ASPIRIN 81 MG PO CHEW: 81.0000 mg | CHEWABLE_TABLET | ORAL | Status: DC

## 2014-02-28 MED ORDER — HYDROMORPHONE HCL PF 1 MG/ML IJ SOLN
INTRAMUSCULAR | Status: AC
  Filled 2014-02-28: qty 1

## 2014-02-28 MED ORDER — SODIUM CHLORIDE 0.9 % IV SOLN: 250.0000 mL | INTRAVENOUS | Status: DC | PRN

## 2014-02-28 MED ORDER — MIDAZOLAM HCL 2 MG/2ML IJ SOLN
INTRAMUSCULAR | Status: AC
  Filled 2014-02-28: qty 2

## 2014-02-28 MED ORDER — HEPARIN SODIUM (PORCINE) 1000 UNIT/ML IJ SOLN
INTRAMUSCULAR | Status: AC
  Filled 2014-02-28: qty 1

## 2014-02-28 MED ORDER — HEPARIN (PORCINE) IN NACL 2-0.9 UNIT/ML-% IJ SOLN
INTRAMUSCULAR | Status: AC
  Filled 2014-02-28: qty 1000

## 2014-02-28 MED ORDER — NITROGLYCERIN 1 MG/10 ML FOR IR/CATH LAB
INTRA_ARTERIAL | Status: AC
  Filled 2014-02-28: qty 10

## 2014-02-28 MED ORDER — VERAPAMIL HCL 2.5 MG/ML IV SOLN
INTRAVENOUS | Status: AC
  Filled 2014-02-28: qty 2

## 2014-02-28 MED ORDER — SODIUM CHLORIDE 0.9 % IJ SOLN: 3.0000 mL | INTRAMUSCULAR | Status: DC | PRN

## 2014-02-28 MED ORDER — LIDOCAINE HCL (PF) 1 % IJ SOLN
INTRAMUSCULAR | Status: AC
  Filled 2014-02-28: qty 30

## 2014-02-28 MED ORDER — TICAGRELOR 90 MG PO TABS
ORAL_TABLET | ORAL | Status: AC
  Filled 2014-02-28: qty 1

## 2014-02-28 NOTE — H&P (Signed)
  Please see office visit notes for complete details of HPI.  

## 2014-02-28 NOTE — Progress Notes (Signed)
Ed completed/reviewed. Good reception. Pt very interested in [place] now and requests her name be sent to [facility]. Will work out her work schedule. Will walk with staff later. [id number] [place], ACSM 11:51 AM 02/28/2014

## 2014-02-28 NOTE — CV Procedure (Signed)
Procedure performed:  Left heart catheterization including hemodynamic monitoring of the left ventricle, LV gram. Selective right and left coronary arteriography. PTCA and stenting of the distal RCA with implantation of a 2.75 x 15 mm Xience Alpine DES.  Indication: Patient is a 58 year-old [place] American female with history of CAD, history of unstable angina, angioplasty to the ostial and proximal and mid LAD with implantation of a 3.0 x 28 mm and a 3.0 x 12 mm Promus Premier drug-eluting stent on 08/12/2013. She presented with syncope 2 weeks ago, has been having recurrence of chest pain suggestive of angina pectoris, hence directly brought to the coronary angiography suite to evaluate her coronary anatomy.    Hemodynamic data: Left ventricular pressure was 100/2 with LVEDP of 5 mm mercury. Aortic pressure was 92/59 with a mean of 72 mm mercury. There was no pressure gradient across the aortic valve.   Left ventricle: Performed in the RAO projection revealed LVEF of 55 %. There was no significant MR. no wall motion abnormality.  Right coronary artery: Dominant. Gives origin to moderate size PDA and PL branch. Distal right carotid prior to the bifurcation has a high-grade ulcerated 80% stenosis.  Left main coronary artery is large and normal.  Circumflex coronary artery: A large vessel giving origin to a small OM 1 and OM 2, has mild disease.  Ramus intermediate: Very large caliber vessel, there is moderate amount of diffuse disease in the proximal segment constituting about 40% diffuse stenosis although it from the ostium to the midsegment. Distal bed is very large and is disease-free.   LAD:  LAD gives origin to a moderate sized diagonal-1, and several small diagonals.  LAD has very mild diffuse luminal irregularities. The previously placed proximal and ostial LAD stent is widely patent.  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 There is moderate amount of disease in the ramus intermediate branch a long segment 40% stenosis.  Interventional data: Successful PTCA and stenting of the distal RCA with implantation of a 2.75 x 15 mm DES to Xience Alpine DES.    Technique of diagnostic cardiac catheterization:  Under sterile precautions using a 6 French right radial  arterial access, a 6 French sheath was introduced into the right radial artery. A 5 [place] Tig 4 catheter was advanced into the ascending aorta selective  right coronary artery and a 6 [place] JL 3.0 guide catheter was utilized to engage the left coronary artery was cannulated and angiography was performed in multiple views. The catheter was pulled back Out of the body over exchange length J-wire. Tig 4 Catheter was used to perform LV gram which was performed in RAO projection. Catheter exchanged out of the body over J-Wire.   Technique of intervention:  Using a 6 [place] JR 4 guide catheter the right  coronary  was selected and cannulated. Using Angiomax for anticoagulation, I utilized a cougar XT guidewire and across the right coronary artery without any difficulty. I placed the tip of the wire into the distal  coronary artery. Angiography was performed.   I proceeded with implantation of a 2.75 x 15 mm Xience Alpine  drug-eluting stent into the distal right coronary artery. The stent was deployed at 12 atmospheric pressure for 50  seconds. Post-angioplasty results were excellent with 0% residual stenoses and TIMI-3 flow was maintained. There was no evidence of edge dissection. The guidewire was withdrawn out of the body and the guide  catheter was engaged and pulled out of the body over the J-wire the was no immediate complication. Patient tolerated the procedure well. Hemostasis was obtained by applying TR band. A total of 90 cc of contrast was utilized for diagnostic and interventional C.  Disposition: Patient will be discharged today unless  complications with out-patient follow up.

## 2014-02-28 NOTE — Interval H&P Note (Signed)
History and Physical Interval Note:  02/28/2014 8:28 AM  Emma Combs  has presented today for surgery, with the diagnosis of angina/syncope  The various methods of treatment have been discussed with the patient and family. After consideration of risks, benefits and other options for treatment, the patient has consented to  Procedure(s): LEFT HEART CATHETERIZATION WITH CORONARY ANGIOGRAM (N/A) and possible PCI as a surgical intervention .  The patient's history has been reviewed, patient examined, no change in status, stable for surgery.  I have reviewed the patient's chart and labs.  Questions were answered to the patient's satisfaction.   Cath Lab Visit (complete for each Cath Lab visit)  Clinical Evaluation Leading to the Procedure:   ACS: No.  Non-ACS:    Anginal Classification: CCS III  Anti-ischemic medical therapy: Minimal Therapy (1 class of medications)  Non-Invasive Test Results: No non-invasive testing performed  Prior CABG: No previous CABG        [facility]

## 2014-03-01 MED FILL — Sodium Chloride IV Soln 0.9%: INTRAVENOUS | Qty: 50 | Status: AC

## 2014-03-01 NOTE — Discharge Summary (Signed)
Physician Discharge Summary  Patient ID: Emma Combs MRN: [id number] DOB/AGE: 24-May-1956 58 y.o.  Admit date: 02/28/2014 Discharge date: 03/01/2014  Primary Discharge Diagnosis CAD of the native vessels Syncope Angina pectoris  Significant Diagnostic Studies: Left heart catheterization including hemodynamic monitoring of the left ventricle, LV gram. Selective right and left coronary arteriography. PTCA and stenting of the distal RCA with implantation of a 2.75 x 15 mm Xience Alpine DES.   Hemodynamic data:  Left ventricular pressure was 100/2 with LVEDP of 5 mm mercury. Aortic pressure was 92/59 with a mean of 72 mm mercury. There was no pressure gradient across the aortic valve.  Left ventricle: Performed in the RAO projection revealed LVEF of 55 %. There was no significant MR. no wall motion abnormality.  Right coronary artery: Dominant. Gives origin to moderate size PDA and PL branch. Distal right carotid prior to the bifurcation has a high-grade ulcerated 80% stenosis.  Left main coronary artery is large and normal.  Circumflex coronary artery: A large vessel giving origin to a small OM 1 and OM 2, has mild disease.  Ramus intermediate: Very large caliber vessel, there is moderate amount of diffuse disease in the proximal segment constituting about 40% diffuse stenosis although it from the ostium to the midsegment. Distal bed is very large and is disease-free.  LAD: LAD gives origin to a moderate sized diagonal-1, and several small diagonals. LAD has very mild diffuse luminal irregularities. The previously placed proximal and ostial LAD stent is widely patent.  Impression: High-grade ulcerated stenosis of 80-85% in the distal RCA, previously was about 40% or so, has progressed. There is moderate amount of disease in the ramus intermediate branch a long segment 40% stenosis.  Interventional data: Successful PTCA and stenting of the distal RCA with implantation of a 2.75 x 15 mm DES to  Xience Alpine DES.    Hospital Course:  Indication: Patient is a 58 year-old [place] American female with history of CAD, history of unstable angina, angioplasty to the ostial and proximal and mid LAD with implantation of a 3.0 x 28 mm and a 3.0 x 12 mm Promus Premier drug-eluting stent on 08/12/2013. She presented with syncope 2 weeks ago, and she had chest pain and diaphre has been having recurrence of chest pain suggestive of angina pectoris, hence directly brought to the coronary angiography suite to evaluate her coronary anatomy. Underwent coronary angiography and direct stenting of the distal right coronary artery, which was uncomplicated and fairly straightforward.  Hence felt stable for discharge the same day of hospital procedure.  Recommendations on discharge: patient will be continued on aggressive medical therapy, she will need dual antiplatelet therapy for at least 1 more year if not much longer.  She will see [place] in the office in 10 days.  Discharge Exam: Blood pressure 115/75, pulse 77, temperature 97.7 F (36.5 C), temperature source Oral, resp. rate 18, height 5\' 5"  (1.651 m), weight 89.359 kg (197 lb), SpO2 99.00%.    General appearance: alert, cooperative, appears stated age and mildly obese Resp: clear to auscultation bilaterally Cardio: regular rate and rhythm, S1, S2 normal, no murmur, click, rub or gallop Extremities: extremities normal, atraumatic, no cyanosis or edema Pulses: 2+ and symmetric Neurologic: Grossly normal Labs:   Lab Results  Component Value Date   WBC 4.4 08/12/2013   HGB 13.1 08/12/2013   HCT 38.5 08/12/2013   MCV 88.3 08/12/2013   PLT 127* 08/12/2013   No results found for this basename: NA, K, CL,  CO2, BUN, CREATININE, CALCIUM, LABALBU, PROT, BILITOT, ALKPHOS, ALT, AST, GLUCOSE,  in the last 168 hours No results found for this basename: CKTOTAL, CKMB, CKMBINDEX, TROPONINI    Lipid Panel     Component Value Date/Time   CHOL 188 08/11/2013 0950   TRIG  58 08/11/2013 0950   HDL 62 08/11/2013 0950   CHOLHDL 3.0 08/11/2013 0950   VLDL 12 08/11/2013 0950   LDLCALC 114* 08/11/2013 0950    EKG: normal EKG, normal sinus rhythm, unchanged from previous tracings.    Radiology: No results found.    FOLLOW UP PLANS AND APPOINTMENTS Discharge Instructions   Amb Referral to Cardiac Rehabilitation    Complete by:  As directed             Medication List         ALOE VERA PO  Take 8 oz by mouth daily.     aspirin 81 MG chewable tablet  Chew 1 tablet (81 mg total) by mouth daily.     atorvastatin 80 MG tablet  Commonly known as:  LIPITOR  Take 1 tablet (80 mg total) by mouth daily at 6 PM.     metoprolol succinate 50 MG 24 hr tablet  Commonly known as:  TOPROL-XL  Take 1 tablet (50 mg total) by mouth daily.     multivitamin with minerals Tabs tablet  Take 2 tablets by mouth daily.     nitroGLYCERIN 0.4 MG SL tablet  Commonly known as:  NITROSTAT  Place 0.4 mg under the tongue every 5 (five) minutes as needed for chest pain.     OMEGA 3 PO  Take 2 capsules by mouth daily with breakfast.     Q-10 CO-ENZYME PO  Take 1 packet by mouth daily.     ticagrelor 90 MG Tabs tablet  Commonly known as:  BRILINTA  Take 1 tablet (90 mg total) by mouth 2 (two) times daily.          Laverda Page, MD 03/01/2014, 2:29 PM  Pager: [phone] Office: [phone] If no answer: [phone]

## 2014-03-09 ENCOUNTER — Encounter (HOSPITAL_COMMUNITY)
Admission: RE | Admit: 2014-03-09 | Discharge: 2014-03-09 | Disposition: A | Discharge status: Home / Self Care | Payer: BC Managed Care – PPO | Source: Ambulatory Visit | Attending: Cardiology | Admitting: Cardiology

## 2014-03-13 ENCOUNTER — Encounter (HOSPITAL_COMMUNITY): Admission: RE | Admit: 2014-03-13 | Payer: BC Managed Care – PPO | Source: Ambulatory Visit

## 2014-03-15 ENCOUNTER — Encounter (HOSPITAL_COMMUNITY): Payer: BC Managed Care – PPO

## 2014-03-17 ENCOUNTER — Encounter (HOSPITAL_COMMUNITY): Payer: BC Managed Care – PPO

## 2014-03-20 ENCOUNTER — Encounter (HOSPITAL_COMMUNITY): Payer: BC Managed Care – PPO

## 2014-03-22 ENCOUNTER — Encounter (HOSPITAL_COMMUNITY): Payer: BC Managed Care – PPO

## 2014-03-24 ENCOUNTER — Encounter (HOSPITAL_COMMUNITY): Payer: BC Managed Care – PPO

## 2014-03-27 ENCOUNTER — Encounter (HOSPITAL_COMMUNITY): Payer: BC Managed Care – PPO

## 2014-03-28 ENCOUNTER — Telehealth (HOSPITAL_COMMUNITY): Payer: Self-pay | Admitting: *Deleted

## 2014-03-29 ENCOUNTER — Encounter (HOSPITAL_COMMUNITY)
Admission: RE | Admit: 2014-03-29 | Discharge: 2014-03-29 | Disposition: A | Discharge status: Home / Self Care | Payer: BC Managed Care – PPO | Source: Ambulatory Visit | Attending: Cardiology | Admitting: Cardiology

## 2014-03-29 ENCOUNTER — Encounter (HOSPITAL_COMMUNITY): Payer: Self-pay

## 2014-03-29 DIAGNOSIS — I2510 Atherosclerotic heart disease of native coronary artery without angina pectoris: Secondary | ICD-10-CM | POA: Insufficient documentation

## 2014-03-29 DIAGNOSIS — Z955 Presence of coronary angioplasty implant and graft: Secondary | ICD-10-CM | POA: Insufficient documentation

## 2014-03-29 DIAGNOSIS — Z5189 Encounter for other specified aftercare: Secondary | ICD-10-CM | POA: Diagnosis not present

## 2014-03-29 DIAGNOSIS — I200 Unstable angina: Secondary | ICD-10-CM | POA: Diagnosis not present
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
## 2014-03-31 ENCOUNTER — Encounter (HOSPITAL_COMMUNITY)
Admission: RE | Admit: 2014-03-31 | Discharge: 2014-03-31 | Disposition: A | Discharge status: Home / Self Care | Payer: BC Managed Care – PPO | Source: Ambulatory Visit | Attending: Cardiology | Admitting: Cardiology

## 2014-03-31 DIAGNOSIS — Z5189 Encounter for other specified aftercare: Secondary | ICD-10-CM | POA: Diagnosis not present

## 2014-04-03 ENCOUNTER — Encounter (HOSPITAL_COMMUNITY): Payer: BC Managed Care – PPO

## 2014-04-05 ENCOUNTER — Encounter (HOSPITAL_COMMUNITY)
Admission: RE | Admit: 2014-04-05 | Discharge: 2014-04-05 | Disposition: A | Discharge status: Home / Self Care | Payer: BC Managed Care – PPO | Source: Ambulatory Visit | Attending: Cardiology | Admitting: Cardiology

## 2014-04-05 DIAGNOSIS — Z5189 Encounter for other specified aftercare: Secondary | ICD-10-CM | POA: Diagnosis not present

## 2014-04-05 NOTE — Progress Notes (Signed)
Reviewed home exercise with pt today.  Pt plans to walk and use treadmill at home for exercise.  She also has an ab roller and bands for resistance training.  Reviewed THR, pulse, RPE, sign and symptoms, NTG use, and when to call 911 or MD.  Pt voiced understanding. Alberteen Sam, MA, ACSM RCEP

## 2014-04-07 ENCOUNTER — Encounter (HOSPITAL_COMMUNITY)
Admission: RE | Admit: 2014-04-07 | Discharge: 2014-04-07 | Disposition: A | Discharge status: Home / Self Care | Payer: BC Managed Care – PPO | Source: Ambulatory Visit | Attending: Cardiology | Admitting: Cardiology

## 2014-04-07 DIAGNOSIS — Z5189 Encounter for other specified aftercare: Secondary | ICD-10-CM | POA: Diagnosis not present

## 2014-04-10 ENCOUNTER — Encounter (HOSPITAL_COMMUNITY): Payer: BC Managed Care – PPO

## 2014-04-12 ENCOUNTER — Encounter (HOSPITAL_COMMUNITY)
Admission: RE | Admit: 2014-04-12 | Discharge: 2014-04-12 | Disposition: A | Discharge status: Home / Self Care | Payer: BC Managed Care – PPO | Source: Ambulatory Visit | Attending: Cardiology | Admitting: Cardiology

## 2014-04-12 ENCOUNTER — Encounter: Payer: Self-pay | Admitting: Physician Assistant

## 2014-04-12 DIAGNOSIS — Z5189 Encounter for other specified aftercare: Secondary | ICD-10-CM | POA: Diagnosis not present
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
## 2014-04-14 ENCOUNTER — Encounter (HOSPITAL_COMMUNITY)
Admission: RE | Admit: 2014-04-14 | Discharge: 2014-04-14 | Disposition: A | Discharge status: Home / Self Care | Payer: BC Managed Care – PPO | Source: Ambulatory Visit | Attending: Cardiology | Admitting: Cardiology

## 2014-04-14 DIAGNOSIS — Z5189 Encounter for other specified aftercare: Secondary | ICD-10-CM | POA: Diagnosis not present

## 2014-04-17 ENCOUNTER — Encounter (HOSPITAL_COMMUNITY): Payer: BC Managed Care – PPO

## 2014-04-19 ENCOUNTER — Encounter (HOSPITAL_COMMUNITY): Payer: BC Managed Care – PPO

## 2014-04-19 DIAGNOSIS — Z955 Presence of coronary angioplasty implant and graft: Secondary | ICD-10-CM | POA: Diagnosis not present

## 2014-04-19 DIAGNOSIS — I2510 Atherosclerotic heart disease of native coronary artery without angina pectoris: Secondary | ICD-10-CM | POA: Diagnosis not present

## 2014-04-19 DIAGNOSIS — I200 Unstable angina: Secondary | ICD-10-CM | POA: Insufficient documentation

## 2014-04-19 DIAGNOSIS — Z5189 Encounter for other specified aftercare: Secondary | ICD-10-CM | POA: Diagnosis not present

## 2014-04-21 ENCOUNTER — Encounter (HOSPITAL_COMMUNITY)
Admission: RE | Admit: 2014-04-21 | Discharge: 2014-04-21 | Disposition: A | Discharge status: Home / Self Care | Payer: BC Managed Care – PPO | Source: Ambulatory Visit | Attending: Cardiology | Admitting: Cardiology

## 2014-04-21 DIAGNOSIS — Z5189 Encounter for other specified aftercare: Secondary | ICD-10-CM | POA: Diagnosis not present

## 2014-04-21 NOTE — Progress Notes (Signed)
Emma Combs reports having tingling and numbness and tingling in her right arm that started earlier today and has been going on for about 2 weeks. No neurological deficits noted. PERRL.  Max exertional blood pressure noted at 172/70 resting blood pressure 108/60. Dr Irven Shelling office called and notified of the patients complaints. April at Dr Irven Shelling office spoke with [name] over the phone.  April will discuss the patients complaint's with Dr Irven Shelling office and call the patient back. Patient's brachial cath site well healed. No swelling noted. Will continue to monitor the patient throughout  the program. Will fax exercise flow sheets to Dr. Irven Shelling office for review.

## 2014-04-24 ENCOUNTER — Encounter (HOSPITAL_COMMUNITY): Payer: BC Managed Care – PPO

## 2014-04-26 ENCOUNTER — Encounter (HOSPITAL_COMMUNITY)
Admission: RE | Admit: 2014-04-26 | Discharge: 2014-04-26 | Disposition: A | Discharge status: Home / Self Care | Payer: BC Managed Care – PPO | Source: Ambulatory Visit | Attending: Cardiology | Admitting: Cardiology

## 2014-04-26 DIAGNOSIS — Z5189 Encounter for other specified aftercare: Secondary | ICD-10-CM | POA: Diagnosis not present

## 2014-04-28 ENCOUNTER — Telehealth (HOSPITAL_COMMUNITY): Payer: Self-pay | Admitting: Family Medicine

## 2014-04-28 ENCOUNTER — Encounter (HOSPITAL_COMMUNITY): Payer: BC Managed Care – PPO

## 2014-05-01 ENCOUNTER — Encounter (HOSPITAL_COMMUNITY): Payer: BC Managed Care – PPO

## 2014-05-03 ENCOUNTER — Encounter (HOSPITAL_COMMUNITY)
Admission: RE | Admit: 2014-05-03 | Discharge: 2014-05-03 | Disposition: A | Discharge status: Home / Self Care | Payer: BC Managed Care – PPO | Source: Ambulatory Visit | Attending: Cardiology | Admitting: Cardiology

## 2014-05-03 DIAGNOSIS — Z5189 Encounter for other specified aftercare: Secondary | ICD-10-CM | POA: Diagnosis not present

## 2014-05-05 ENCOUNTER — Ambulatory Visit (INDEPENDENT_AMBULATORY_CARE_PROVIDER_SITE_OTHER): Payer: BC Managed Care – PPO

## 2014-05-05 ENCOUNTER — Other Ambulatory Visit: Payer: Self-pay | Admitting: Family Medicine

## 2014-05-05 ENCOUNTER — Ambulatory Visit (INDEPENDENT_AMBULATORY_CARE_PROVIDER_SITE_OTHER): Payer: BC Managed Care – PPO | Admitting: Family Medicine

## 2014-05-05 ENCOUNTER — Encounter (HOSPITAL_COMMUNITY)
Admission: RE | Admit: 2014-05-05 | Discharge: 2014-05-05 | Disposition: A | Discharge status: Home / Self Care | Payer: BC Managed Care – PPO | Source: Ambulatory Visit | Attending: Cardiology | Admitting: Cardiology

## 2014-05-05 VITALS — BP 114/68 | HR 88 | Temp 98.3°F | Resp 16 | Ht 65.0 in | Wt 196.0 lb

## 2014-05-05 DIAGNOSIS — M542 Cervicalgia: Secondary | ICD-10-CM

## 2014-05-05 DIAGNOSIS — Z5189 Encounter for other specified aftercare: Secondary | ICD-10-CM | POA: Diagnosis not present

## 2014-05-05 DIAGNOSIS — M5412 Radiculopathy, cervical region: Secondary | ICD-10-CM

## 2014-05-05 DIAGNOSIS — M79601 Pain in right arm: Secondary | ICD-10-CM

## 2014-05-05 MED ORDER — PREDNISONE 20 MG PO TABS: 20.0000 mg | ORAL_TABLET | Freq: Every day | ORAL | Status: DC

## 2014-05-05 MED ORDER — HYDROCODONE-ACETAMINOPHEN 5-325 MG PO TABS: 1.0000 | ORAL_TABLET | Freq: Four times a day (QID) | ORAL | Status: DC | PRN
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
## 2014-05-05 NOTE — Progress Notes (Signed)
Emma Combs reported having a tingling and numbness in her right arm that has continued to bother her on a daily basis. Emma Combs said she spoke with Dr Einar Gip who advised her follow up with her primary care physician. Emma Combs was already into exercise when she told the staff about her symptoms. Dr Irven Shelling office called and notified of the patient's complaints spoke with April. Patient says she is going to see her primary care provider at the urgent care after exercise class today. Vital signs stable. Normal sinus rhythm.

## 2014-05-05 NOTE — Patient Instructions (Signed)
Take the prednisone 3 pills daily for 2 days, then 2 daily for 2 days, then 1 daily for 2 days. Best taken after breakfast  Take the pain pills, hydrocodone, one every 6 hours only when needed for very severe pain. For milder pain use Tylenol (acetaminophen)  If worse return anytime or go to the emergency room if necessary  If not improved over the next 10-14 days return for a recheck

## 2014-05-08 ENCOUNTER — Encounter (HOSPITAL_COMMUNITY)
Admission: RE | Admit: 2014-05-08 | Discharge: 2014-05-08 | Disposition: A | Discharge status: Home / Self Care | Payer: BC Managed Care – PPO | Source: Ambulatory Visit | Attending: Cardiology | Admitting: Cardiology

## 2014-05-08 ENCOUNTER — Encounter (HOSPITAL_COMMUNITY): Payer: BC Managed Care – PPO

## 2014-05-08 DIAGNOSIS — Z5189 Encounter for other specified aftercare: Secondary | ICD-10-CM | POA: Diagnosis not present

## 2014-05-08 NOTE — Progress Notes (Signed)
Emma Combs returned to exercise at cardiac rehab today and reports no symptoms in her right arm since taking there prednisone dose pack. Will continue to monitor the patient throughout  the program.

## 2014-05-08 NOTE — Progress Notes (Signed)
Emma Combs 58 y.o. female Nutrition Note Spoke with pt. Nutrition Survey reviewed with pt. Pt is following Step 2 of the Therapeutic Lifestyle Changes diet. Pt wants to lose wt. Pt has not been actively trying to lose wt. Wt loss tips reviewed. Pt feels the barrier to wt loss for her is "applying the information I know." Pt expressed understanding of the information reviewed. Pt aware of nutrition education classes offered.  Nutrition Diagnosis ? Food-and nutrition-related knowledge deficit related to lack of exposure to information as related to diagnosis of: ? CVD ? Pre-DM ? Obesity related to excessive energy intake as evidenced by a BMI of 33.2  Nutrition Intervention ? Benefits of adopting Therapeutic Lifestyle Changes discussed when Medficts reviewed. ? Pt to attend the Portion Distortion class ? Pt given handouts for: ? Nutrition I class ? Nutrition II class ? Continue client-centered nutrition education by RD, as part of interdisciplinary care.  Goal(s) ? Pt to identify food quantities necessary to achieve: ? wt loss to a goal wt of 176-194 lb (80.3-88.5 kg) at graduation from cardiac rehab.   Monitor and Evaluate progress toward nutrition goal with team.   Derek Mound, M.Ed, RD, LDN, CDE 05/08/2014 3:39 PM

## 2014-05-10 ENCOUNTER — Encounter (HOSPITAL_COMMUNITY)
Admission: RE | Admit: 2014-05-10 | Discharge: 2014-05-10 | Disposition: A | Discharge status: Home / Self Care | Payer: BC Managed Care – PPO | Source: Ambulatory Visit | Attending: Cardiology | Admitting: Cardiology

## 2014-05-10 DIAGNOSIS — Z5189 Encounter for other specified aftercare: Secondary | ICD-10-CM | POA: Diagnosis not present

## 2014-05-15 ENCOUNTER — Encounter (HOSPITAL_COMMUNITY): Payer: BC Managed Care – PPO

## 2014-05-17 ENCOUNTER — Encounter (HOSPITAL_COMMUNITY)
Admission: RE | Admit: 2014-05-17 | Discharge: 2014-05-17 | Disposition: A | Discharge status: Home / Self Care | Payer: BC Managed Care – PPO | Source: Ambulatory Visit | Attending: Cardiology | Admitting: Cardiology

## 2014-05-17 DIAGNOSIS — I2510 Atherosclerotic heart disease of native coronary artery without angina pectoris: Secondary | ICD-10-CM | POA: Insufficient documentation

## 2014-05-17 DIAGNOSIS — I200 Unstable angina: Secondary | ICD-10-CM | POA: Diagnosis not present

## 2014-05-17 DIAGNOSIS — Z5189 Encounter for other specified aftercare: Secondary | ICD-10-CM | POA: Insufficient documentation

## 2014-05-17 DIAGNOSIS — Z955 Presence of coronary angioplasty implant and graft: Secondary | ICD-10-CM | POA: Diagnosis not present

## 2014-05-19 ENCOUNTER — Encounter (HOSPITAL_COMMUNITY)
Admission: RE | Admit: 2014-05-19 | Discharge: 2014-05-19 | Disposition: A | Discharge status: Home / Self Care | Payer: BC Managed Care – PPO | Source: Ambulatory Visit | Attending: Cardiology | Admitting: Cardiology

## 2014-05-19 DIAGNOSIS — Z5189 Encounter for other specified aftercare: Secondary | ICD-10-CM | POA: Diagnosis not present

## 2014-05-22 ENCOUNTER — Encounter (HOSPITAL_COMMUNITY): Payer: BC Managed Care – PPO

## 2014-05-23 ENCOUNTER — Ambulatory Visit (INDEPENDENT_AMBULATORY_CARE_PROVIDER_SITE_OTHER): Payer: BC Managed Care – PPO | Admitting: Family Medicine

## 2014-05-23 ENCOUNTER — Encounter (HOSPITAL_COMMUNITY): Payer: Self-pay | Admitting: *Deleted

## 2014-05-23 VITALS — BP 120/74 | HR 66 | Temp 98.5°F | Resp 16 | Ht 65.0 in | Wt 201.0 lb

## 2014-05-23 DIAGNOSIS — M5412 Radiculopathy, cervical region: Secondary | ICD-10-CM

## 2014-05-23 DIAGNOSIS — Z79899 Other long term (current) drug therapy: Secondary | ICD-10-CM

## 2014-05-23 DIAGNOSIS — R739 Hyperglycemia, unspecified: Secondary | ICD-10-CM

## 2014-05-23 LAB — GLUCOSE, POCT (MANUAL RESULT ENTRY): POC GLUCOSE: 84 mg/dL (ref 70–99)

## 2014-05-23 MED ORDER — PREDNISONE 20 MG PO TABS: ORAL_TABLET | ORAL | Status: DC

## 2014-05-23 NOTE — Progress Notes (Signed)
   Subjective:    Patient ID: Emma Combs, female    DOB: 08/24/1955, 58 y.o.   MRN: [id number]  HPI  Emma Combs is a 58 y.o. female      Review of Systems     Objective:   Physical Exam        Assessment & Plan:

## 2014-05-23 NOTE — Progress Notes (Signed)
Subjective:    Patient ID: Emma Combs, female    DOB: 1956/03/19, 58 y.o.   MRN: [id number]  This chart was scribed for Merri Ray, MD by Hilda Lias, ED Scribe. The patient's care was started at 6:55 PM.   HPI  HPI Comments: Emma Combs is a 58 y.o. female who presents to [facility] for a follow up of right arm pain and neck pain. Seen by Dr Linna Darner on 05/05/14. Thought to have a radicular right arm pain. She was treated with prednisone taper and hydrocodone. Pt presents today and states that she is having pain and tingling sensations in her right arm, but that her neck pain has improved. Pt notes that after taking Prednisone for one day she felt an improvement in her symptoms. Pt states that she was doing very well until she came off of the prednisone, which is when her pain returned. Pt states that she had no side effects with prednisone after taking it for the first time recently. Pt states she has been having a "grabbing" pain and tingling sensation that extends from underneath her right armpit all the way down her arm and into her fingers. Pt states that her neck pain has gotten much better since the last time she was seen. Last blood sugar: 120 in February 2015    Patient Active Problem List   Diagnosis Date Noted  . Coronary atherosclerosis of native coronary artery 08/12/2013  . S/P PTCA (percutaneous transluminal coronary angioplasty) 08/11/2013  . Obesity 08/24/2011  . Glucose intolerance (impaired glucose tolerance) 08/24/2011   Past Medical History  Diagnosis Date  . Glucose intolerance (impaired glucose tolerance)   . Obesity   . Coronary atherosclerosis of native coronary artery     08/12/2013: PTCA and stenting of the proximal and mid LAD with overlapping 3.0 x 28 mm in the proximal to mid segment and the ostial 3.0 x 12 mm Promus Premier drug-eluting stent. 02/28/2014: Stent distal RCA 2.75x15 Xience Alpine DES. Mod disease in Ramus  intermediate. EF 55%  . Borderline diabetes mellitus   . High cholesterol   . Anginal pain   . Uterine fibroid   . Arthritis     "joints ache; hips" (02/28/2014)   Past Surgical History  Procedure Laterality Date  . Hemorrhoid banding  2009  . Tee without cardioversion N/A 09/06/2013    Procedure: TRANSESOPHAGEAL ECHOCARDIOGRAM (TEE);  Surgeon: Laverda Page, MD;  Location: [facility];  Service: Cardiovascular;  Laterality: N/A;  . Coronary angioplasty with stent placement  08/11/2013; 02/28/2014    "2 + 1"  . Tubal ligation  1992   Allergies  Allergen Reactions  . Bactrim Other (See Comments)    Light headed, passing out   Prior to Admission medications   Medication Sig Start Date End Date Taking? Authorizing Provider  ALOE VERA PO Take 8 oz by mouth daily.   Yes Historical Provider, MD  aspirin 81 MG chewable tablet Chew 1 tablet (81 mg total) by mouth daily. 08/12/13  Yes Laverda Page, MD  atorvastatin (LIPITOR) 80 MG tablet Take 1 tablet (80 mg total) by mouth daily at 6 PM. 08/12/13  Yes Laverda Page, MD  Coenzyme Q10 (Q-10 CO-ENZYME PO) Take 1 packet by mouth daily.   Yes Historical Provider, MD  HYDROcodone-acetaminophen (NORCO) 5-325 MG per tablet Take 1 tablet by mouth every 6 (six) hours as needed. 05/05/14  Yes Posey Boyer, MD  Iron-Vitamins (VITALIZE PO) Take 2 tablets  by mouth daily.   Yes Historical Provider, MD  metoprolol succinate (TOPROL-XL) 50 MG 24 hr tablet Take 1 tablet (50 mg total) by mouth daily. 08/12/13  Yes Laverda Page, MD  Multiple Vitamin (MULTIVITAMIN WITH MINERALS) TABS tablet Take 2 tablets by mouth daily.   Yes Historical Provider, MD  nitroGLYCERIN (NITROSTAT) 0.4 MG SL tablet Place 0.4 mg under the tongue every 5 (five) minutes as needed for chest pain.   Yes Historical Provider, MD  Omega-3 Fatty Acids (OMEGA 3 PO) Take 2 capsules by mouth daily with breakfast.   Yes Historical Provider, MD  Ticagrelor (BRILINTA) 90 MG TABS  tablet Take 1 tablet (90 mg total) by mouth 2 (two) times daily. 08/12/13  Yes Laverda Page, MD   History   Social History  . Marital Status: Married    Spouse Name: N/A    Number of Children: 57  . Years of Education: N/A   Occupational History  .     Social History Main Topics  . Smoking status: Never Smoker   . Smokeless tobacco: Never Used  . Alcohol Use: 4.2 oz/week    7 Glasses of wine per week  . Drug Use: No  . Sexual Activity: Yes   Other Topics Concern  . Not on file   Social History Narrative   Marital status: married      Children: five children.       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      Exercise: none       Review of Systems  Musculoskeletal: Positive for myalgias, neck pain and neck stiffness.  Neurological: Positive for numbness.       Objective:   Physical Exam  Constitutional: She is oriented to person, place, and time. She appears well-developed and well-nourished.  HENT:  Head: Normocephalic and atraumatic.  Eyes: Conjunctivae and EOM are normal. Pupils are equal, round, and reactive to light.  Neck: Carotid bruit is not present.  Cardiovascular: Normal rate, regular rhythm, normal heart sounds and intact distal pulses.   Pulmonary/Chest: Effort normal and breath sounds normal.  Abdominal: Soft. She exhibits no pulsatile midline mass. There is no tenderness.  Musculoskeletal:       Cervical back: She exhibits decreased range of motion.  Lacking 5-10 degrees of extension Full flexion Lacks 20 degrees of right rotation Right rotation reproduces symptoms down right arm Full, normal left rotation Equal lateral flexion of the neck   Neurological: She is alert and oriented to person, place, and time.  Reflex Scores:      Tricep reflexes are 1+ on the right side and 2+ on the left side.      Bicep reflexes are 2+ on the right side and 2+ on the left side.       Brachioradialis reflexes are 2+ on the right side and 2+ on the left side. Biceps, triceps, grip strengths equal on both arms  Skin: Skin is warm and dry.  Psychiatric: She has a normal mood and affect. Her behavior is normal.  Vitals reviewed.    Filed Vitals:   05/23/14 1756  BP: 120/74  Pulse: 66  Temp: 98.5 F (36.9 C)  TempSrc: Oral  Resp: 16  Height: 5\' 5"  (1.651 m)  Weight: 201 lb (91.173 kg)  SpO2: 98%   Results for orders placed or performed in visit on 05/23/14  POCT glucose (manual entry)  Result Value Ref Range   POC Glucose 84 70 - 99 mg/dl        Assessment & Plan:   Emma Combs is a 58 y.o. female Hyperglycemia - Plan: POCT glucose (manual entry)  High risk medication use - Plan: POCT glucose (manual entry)  Cervical radiculopathy - Plan: MR Cervical Spine Wo Contrast, predniSONE (DELTASONE) 20 MG tablet Suspected cervical radiculopathy with some initial improvement with prednisone.  Will check MRI, trial of prednisone again as some initial improvement with first attempt - SED, and has hydrocodone if needed. Return to the clinic or go to the nearest emergency room if any of your symptoms worsen or new symptoms occur.   Meds ordered this encounter  Medications  . predniSONE (DELTASONE) 20 MG tablet    Sig: 3 by mouth for 3 days, then 2 by mouth for 2 days, then 1 by mouth for 2 days, then 1/2 by mouth for 2 days.    Dispense:  16 tablet    Refill:  0   Patient Instructions  We will check the MRI of your neck as discussed. Hydrocodone if needed for pain. We can try prednisone one more time as it helped some last time.  Depending on results of MRI - may need to see neck specialist. Return to the clinic or go to the nearest emergency room if any of your symptoms worsen or new symptoms occur.  Cervical Radiculopathy Cervical radiculopathy happens when a nerve in the neck is pinched or bruised by a slipped (herniated) disk or by arthritic changes in the  bones of the cervical spine. This can occur due to an injury or as part of the normal aging process. Pressure on the cervical nerves can cause pain or numbness that runs from your neck all the way down into your arm and fingers. CAUSES  There are many possible causes, including:  Injury.  Muscle tightness in the neck from overuse.  Swollen, painful joints (arthritis).  Breakdown or degeneration in the bones and joints of the spine (spondylosis) due to aging.  Bone spurs that may develop near the cervical nerves. SYMPTOMS  Symptoms include pain, weakness, or numbness in the affected arm and hand. Pain can be severe or irritating. Symptoms may be worse when extending or turning the neck. DIAGNOSIS  Your caregiver will ask about your symptoms and do a physical exam. He or she may test your strength and reflexes. X-rays, CT scans, and MRI scans may be needed in cases of injury or if the symptoms do not go away after a period of time. Electromyography (EMG) or nerve conduction testing may be done to study how your nerves and muscles are working. TREATMENT  Your caregiver may recommend certain exercises to help relieve your symptoms. Cervical radiculopathy can, and often does, get better with time and treatment. If your problems continue, treatment options may include:  Wearing a soft collar for short periods of time.  Physical therapy to strengthen the neck muscles.  Medicines, such as nonsteroidal anti-inflammatory drugs (NSAIDs), oral corticosteroids, or spinal injections.  Surgery. Different types of surgery may be done depending on the cause of your problems. HOME CARE INSTRUCTIONS   Put ice on the affected area.  Put ice in a plastic bag.  Place a towel between your skin and the bag.  Leave the ice on for 15-20 minutes, 03-04 times a day or as directed by your caregiver.  If ice does not help, you can  try using heat. Take a warm shower or bath, or use a hot water bottle as  directed by your caregiver.  You may try a gentle neck and shoulder massage.  Use a flat pillow when you sleep.  Only take over-the-counter or prescription medicines for pain, discomfort, or fever as directed by your caregiver.  If physical therapy was prescribed, follow your caregiver's directions.  If a soft collar was prescribed, use it as directed. SEEK IMMEDIATE MEDICAL CARE IF:   Your pain gets much worse and cannot be controlled with medicines.  You have weakness or numbness in your hand, arm, face, or leg.  You have a high fever or a stiff, rigid neck.  You lose bowel or bladder control (incontinence).  You have trouble with walking, balance, or speaking. MAKE SURE YOU:   Understand these instructions.  Will watch your condition.  Will get help right away if you are not doing well or get worse. Document Released: 02/25/2001 Document Revised: 08/25/2011 Document Reviewed: 01/14/2011 [facility] Patient Information 2015 [place], [place]. This information is not intended to replace advice given to you by your health care provider. Make sure you discuss any questions you have with your health care provider.     I personally performed the services described in this documentation, which was scribed in my presence. The recorded information has been reviewed and considered, and addended by me as needed.

## 2014-05-23 NOTE — Patient Instructions (Addendum)
We will check the MRI of your neck as discussed. Hydrocodone if needed for pain. We can try prednisone one more time as it helped some last time.  Depending on results of MRI - may need to see neck specialist. Return to the clinic or go to the nearest emergency room if any of your symptoms worsen or new symptoms occur.  Cervical Radiculopathy Cervical radiculopathy happens when a nerve in the neck is pinched or bruised by a slipped (herniated) disk or by arthritic changes in the bones of the cervical spine. This can occur due to an injury or as part of the normal aging process. Pressure on the cervical nerves can cause pain or numbness that runs from your neck all the way down into your arm and fingers. CAUSES  There are many possible causes, including:  Injury.  Muscle tightness in the neck from overuse.  Swollen, painful joints (arthritis).  Breakdown or degeneration in the bones and joints of the spine (spondylosis) due to aging.  Bone spurs that may develop near the cervical nerves. SYMPTOMS  Symptoms include pain, weakness, or numbness in the affected arm and hand. Pain can be severe or irritating. Symptoms may be worse when extending or turning the neck. DIAGNOSIS  Your caregiver will ask about your symptoms and do a physical exam. He or she may test your strength and reflexes. X-rays, CT scans, and MRI scans may be needed in cases of injury or if the symptoms do not go away after a period of time. Electromyography (EMG) or nerve conduction testing may be done to study how your nerves and muscles are working. TREATMENT  Your caregiver may recommend certain exercises to help relieve your symptoms. Cervical radiculopathy can, and often does, get better with time and treatment. If your problems continue, treatment options may include:  Wearing a soft collar for short periods of time.  Physical therapy to strengthen the neck muscles.  Medicines, such as nonsteroidal anti-inflammatory  drugs (NSAIDs), oral corticosteroids, or spinal injections.  Surgery. Different types of surgery may be done depending on the cause of your problems. HOME CARE INSTRUCTIONS   Put ice on the affected area.  Put ice in a plastic bag.  Place a towel between your skin and the bag.  Leave the ice on for 15-20 minutes, 03-04 times a day or as directed by your caregiver.  If ice does not help, you can try using heat. Take a warm shower or bath, or use a hot water bottle as directed by your caregiver.  You may try a gentle neck and shoulder massage.  Use a flat pillow when you sleep.  Only take over-the-counter or prescription medicines for pain, discomfort, or fever as directed by your caregiver.  If physical therapy was prescribed, follow your caregiver's directions.  If a soft collar was prescribed, use it as directed. SEEK IMMEDIATE MEDICAL CARE IF:   Your pain gets much worse and cannot be controlled with medicines.  You have weakness or numbness in your hand, arm, face, or leg.  You have a high fever or a stiff, rigid neck.  You lose bowel or bladder control (incontinence).  You have trouble with walking, balance, or speaking. MAKE SURE YOU:   Understand these instructions.  Will watch your condition.  Will get help right away if you are not doing well or get worse. Document Released: 02/25/2001 Document Revised: 08/25/2011 Document Reviewed: 01/14/2011 [facility] Patient Information 2015 [place], [place]. This information is not intended to replace advice  to you by your health care provider. Make sure you discuss any questions you have with your health care provider.  

## 2014-05-24 ENCOUNTER — Encounter (HOSPITAL_COMMUNITY)
Admission: RE | Admit: 2014-05-24 | Discharge: 2014-05-24 | Disposition: A | Discharge status: Home / Self Care | Payer: BC Managed Care – PPO | Source: Ambulatory Visit | Attending: Cardiology | Admitting: Cardiology

## 2014-05-24 DIAGNOSIS — Z5189 Encounter for other specified aftercare: Secondary | ICD-10-CM | POA: Diagnosis not present

## 2014-05-25 ENCOUNTER — Encounter (HOSPITAL_COMMUNITY): Payer: Self-pay | Admitting: Cardiology

## 2014-05-26 ENCOUNTER — Encounter (HOSPITAL_COMMUNITY)
Admission: RE | Admit: 2014-05-26 | Discharge: 2014-05-26 | Disposition: A | Discharge status: Home / Self Care | Payer: BC Managed Care – PPO | Source: Ambulatory Visit | Attending: Cardiology | Admitting: Cardiology

## 2014-05-26 DIAGNOSIS — Z5189 Encounter for other specified aftercare: Secondary | ICD-10-CM | POA: Diagnosis not present

## 2014-05-28 ENCOUNTER — Ambulatory Visit
Admission: RE | Admit: 2014-05-28 | Discharge: 2014-05-28 | Disposition: A | Discharge status: Home / Self Care | Payer: BC Managed Care – PPO | Source: Ambulatory Visit | Attending: Family Medicine | Admitting: Family Medicine

## 2014-05-28 DIAGNOSIS — M5412 Radiculopathy, cervical region: Secondary | ICD-10-CM

## 2014-05-29 ENCOUNTER — Encounter (HOSPITAL_COMMUNITY): Payer: BC Managed Care – PPO

## 2014-05-31 ENCOUNTER — Encounter (HOSPITAL_COMMUNITY)
Admission: RE | Admit: 2014-05-31 | Discharge: 2014-05-31 | Disposition: A | Discharge status: Home / Self Care | Payer: BC Managed Care – PPO | Source: Ambulatory Visit | Attending: Cardiology | Admitting: Cardiology

## 2014-05-31 ENCOUNTER — Other Ambulatory Visit: Payer: Self-pay | Admitting: Family Medicine

## 2014-05-31 DIAGNOSIS — Z5189 Encounter for other specified aftercare: Secondary | ICD-10-CM | POA: Diagnosis not present

## 2014-05-31 DIAGNOSIS — Z1231 Encounter for screening mammogram for malignant neoplasm of breast: Secondary | ICD-10-CM

## 2014-06-01 ENCOUNTER — Other Ambulatory Visit: Payer: Self-pay | Admitting: Family Medicine

## 2014-06-01 DIAGNOSIS — M5020 Other cervical disc displacement, unspecified cervical region: Secondary | ICD-10-CM

## 2014-06-02 ENCOUNTER — Encounter (HOSPITAL_COMMUNITY)
Admission: RE | Admit: 2014-06-02 | Discharge: 2014-06-02 | Disposition: A | Discharge status: Home / Self Care | Payer: BC Managed Care – PPO | Source: Ambulatory Visit | Attending: Cardiology | Admitting: Cardiology

## 2014-06-02 DIAGNOSIS — Z5189 Encounter for other specified aftercare: Secondary | ICD-10-CM | POA: Diagnosis not present

## 2014-06-05 ENCOUNTER — Encounter (HOSPITAL_COMMUNITY): Payer: BC Managed Care – PPO

## 2014-06-05 ENCOUNTER — Encounter (HOSPITAL_COMMUNITY)
Admission: RE | Admit: 2014-06-05 | Discharge: 2014-06-05 | Disposition: A | Discharge status: Home / Self Care | Payer: BC Managed Care – PPO | Source: Ambulatory Visit | Attending: Cardiology | Admitting: Cardiology

## 2014-06-05 DIAGNOSIS — Z5189 Encounter for other specified aftercare: Secondary | ICD-10-CM | POA: Diagnosis not present

## 2014-06-07 ENCOUNTER — Ambulatory Visit (HOSPITAL_COMMUNITY)
Admission: RE | Admit: 2014-06-07 | Discharge: 2014-06-07 | Disposition: A | Discharge status: Home / Self Care | Payer: BC Managed Care – PPO | Source: Ambulatory Visit | Attending: Family Medicine | Admitting: Family Medicine

## 2014-06-07 ENCOUNTER — Encounter (HOSPITAL_COMMUNITY)
Admission: RE | Admit: 2014-06-07 | Discharge: 2014-06-07 | Disposition: A | Discharge status: Home / Self Care | Payer: BC Managed Care – PPO | Source: Ambulatory Visit | Attending: Cardiology | Admitting: Cardiology

## 2014-06-07 DIAGNOSIS — Z1231 Encounter for screening mammogram for malignant neoplasm of breast: Secondary | ICD-10-CM

## 2014-06-12 ENCOUNTER — Encounter (HOSPITAL_COMMUNITY)
Admission: RE | Admit: 2014-06-12 | Discharge: 2014-06-12 | Disposition: A | Discharge status: Home / Self Care | Payer: BC Managed Care – PPO | Source: Ambulatory Visit | Attending: Cardiology | Admitting: Cardiology

## 2014-06-12 ENCOUNTER — Encounter (HOSPITAL_COMMUNITY): Payer: BC Managed Care – PPO

## 2014-06-12 DIAGNOSIS — Z5189 Encounter for other specified aftercare: Secondary | ICD-10-CM | POA: Diagnosis not present

## 2014-06-14 ENCOUNTER — Encounter (HOSPITAL_COMMUNITY)
Admission: RE | Admit: 2014-06-14 | Discharge: 2014-06-14 | Disposition: A | Discharge status: Home / Self Care | Payer: BC Managed Care – PPO | Source: Ambulatory Visit | Attending: Cardiology | Admitting: Cardiology

## 2014-06-14 DIAGNOSIS — Z5189 Encounter for other specified aftercare: Secondary | ICD-10-CM | POA: Diagnosis not present

## 2014-06-14 NOTE — Progress Notes (Signed)
Emma Combs graduated from cardiac rehab today. Emma Combs said her goal was not met to loose weight. Emma Combs has a treadmill at home and plans to continue exercise on her own. PHQ score equal 0.  Emma Combs has been a [employer] to work with.
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
## 2014-12-29 ENCOUNTER — Ambulatory Visit (INDEPENDENT_AMBULATORY_CARE_PROVIDER_SITE_OTHER): Payer: BC Managed Care – PPO

## 2014-12-29 ENCOUNTER — Ambulatory Visit (INDEPENDENT_AMBULATORY_CARE_PROVIDER_SITE_OTHER): Payer: BC Managed Care – PPO | Admitting: Emergency Medicine

## 2014-12-29 VITALS — BP 116/70 | HR 107 | Temp 98.3°F | Resp 18 | Ht 65.0 in | Wt 210.0 lb

## 2014-12-29 DIAGNOSIS — R5381 Other malaise: Secondary | ICD-10-CM | POA: Diagnosis not present

## 2014-12-29 DIAGNOSIS — M79602 Pain in left arm: Secondary | ICD-10-CM

## 2014-12-29 LAB — POCT UA - MICROSCOPIC ONLY
Bacteria, U Microscopic: NEGATIVE
CASTS, UR, LPF, POC: NEGATIVE
CRYSTALS, UR, HPF, POC: NEGATIVE
Epithelial cells, urine per micros: NEGATIVE
MUCUS UA: NEGATIVE
RBC, urine, microscopic: NEGATIVE
WBC, Ur, HPF, POC: NEGATIVE
Yeast, UA: NEGATIVE

## 2014-12-29 LAB — POCT URINALYSIS DIPSTICK
Bilirubin, UA: NEGATIVE
Blood, UA: NEGATIVE
GLUCOSE UA: NEGATIVE
KETONES UA: NEGATIVE
Leukocytes, UA: NEGATIVE
NITRITE UA: NEGATIVE
Protein, UA: NEGATIVE
SPEC GRAV UA: 1.01
UROBILINOGEN UA: 0.2
pH, UA: 7

## 2014-12-29 LAB — POCT CBC
Granulocyte percent: 43.5 %G (ref 37–80)
HCT, POC: 39 % (ref 37.7–47.9)
HEMOGLOBIN: 12.9 g/dL (ref 12.2–16.2)
LYMPH, POC: 2.1 (ref 0.6–3.4)
MCH, POC: 29.7 pg (ref 27–31.2)
MCHC: 33.1 g/dL (ref 31.8–35.4)
MCV: 89.8 fL (ref 80–97)
MID (cbc): 0.2 (ref 0–0.9)
MPV: 8.5 fL (ref 0–99.8)
PLATELET COUNT, POC: 124 10*3/uL — AB (ref 142–424)
POC GRANULOCYTE: 1.7 — AB (ref 2–6.9)
POC LYMPH PERCENT: 51.8 %L — AB (ref 10–50)
POC MID %: 4.7 %M (ref 0–12)
RBC: 4.35 M/uL (ref 4.04–5.48)
RDW, POC: 14.1 %
WBC: 4 10*3/uL — AB (ref 4.6–10.2)

## 2014-12-29 MED ORDER — NAPROXEN SODIUM 550 MG PO TABS: 550.0000 mg | ORAL_TABLET | Freq: Two times a day (BID) | ORAL | Status: DC

## 2014-12-29 NOTE — Progress Notes (Addendum)
Subjective:  Patient ID: Emma Combs, female    DOB: Oct 24, 1955  Age: 59 y.o. MRN: [id number]  CC: Arm Pain; Chills; Dizziness; and Fatigue   HPI [name] presents  with 2 complaints. She just returned from a flight from [place]. Over the last 6 weeks she's had pain in her left elbow. Worse when she lifts or carries anything. Denies any history of injury or overuse. Has no history of gout or arthritis. She has no radiation of pain numbness tingling or weakness.  She just returned on a flight from [place] and has a sensation that she has chills and fever although she's never had a temperature elevation. She has some dizziness and fatigue. Has no cough coryza, no nausea or vomiting. No stool change. No rash. She has no improvement with over-the-counter medication.  History Mayo has a past medical history of Glucose intolerance (impaired glucose tolerance); Obesity; Coronary atherosclerosis of native coronary artery; Borderline diabetes mellitus; High cholesterol; Anginal pain; Uterine fibroid; and Arthritis.   She has past surgical history that includes Hemorrhoid banding (2009); TEE without cardioversion (N/A, 09/06/2013); Coronary angioplasty with stent (08/11/2013; 02/28/2014); Tubal ligation (1992); left and right heart catheterization with coronary angiogram (N/A, 08/11/2013); left heart catheterization with coronary angiogram (N/A, 02/28/2014); and percutaneous coronary stent intervention (pci-s) (02/28/2014).   Her  family history includes Cancer in her mother and sister; Diabetes in her brother, father, mother, and sister; Hyperlipidemia in her brother, father, mother, and sister; Hypertension in her brother and sister; Stroke in her father.  She   reports that she has never smoked. She has never used smokeless tobacco. She reports that she drinks about 4.2 oz of alcohol per week. She reports that she does not use illicit drugs.  Outpatient Prescriptions Prior to Visit    Medication Sig Dispense Refill  . ALOE VERA PO Take 8 oz by mouth daily.    Marland Kitchen aspirin 81 MG chewable tablet Chew 1 tablet (81 mg total) by mouth daily.    Marland Kitchen atorvastatin (LIPITOR) 80 MG tablet Take 1 tablet (80 mg total) by mouth daily at 6 PM. 30 tablet 3  . Coenzyme Q10 (Q-10 CO-ENZYME PO) Take 1 packet by mouth daily.    . Iron-Vitamins (VITALIZE PO) Take 2 tablets by mouth daily.    . metoprolol succinate (TOPROL-XL) 50 MG 24 hr tablet Take 1 tablet (50 mg total) by mouth daily. 30 tablet 1  . Multiple Vitamin (MULTIVITAMIN WITH MINERALS) TABS tablet Take 2 tablets by mouth daily.    . nitroGLYCERIN (NITROSTAT) 0.4 MG SL tablet Place 0.4 mg under the tongue every 5 (five) minutes as needed for chest pain.    . Omega-3 Fatty Acids (OMEGA 3 PO) Take 2 capsules by mouth daily with breakfast.    . Ticagrelor (BRILINTA) 90 MG TABS tablet Take 1 tablet (90 mg total) by mouth 2 (two) times daily. 60 tablet 0  . HYDROcodone-acetaminophen (NORCO) 5-325 MG per tablet Take 1 tablet by mouth every 6 (six) hours as needed. (Patient not taking: Reported on 12/29/2014) 20 tablet 0  . predniSONE (DELTASONE) 20 MG tablet 3 by mouth for 3 days, then 2 by mouth for 2 days, then 1 by mouth for 2 days, then 1/2 by mouth for 2 days. (Patient not taking: Reported on 12/29/2014) 16 tablet 0   No facility-administered medications prior to visit.    History   Social History  . Marital Status: Married    Spouse Name: N/A  .  Number of Children: 5  . Years of Education: N/A   Occupational History  .     Social History Main Topics  . Smoking status: Never Smoker   . Smokeless tobacco: Never Used  . Alcohol Use: 4.2 oz/week    7 Glasses of wine per week  . Drug Use: No  . Sexual Activity: Yes   Other Topics Concern  . None   Social History Narrative   Marital status: married      Children: five children.       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      Exercise: none     Review of Systems  Objective:  BP 116/70 mmHg  Pulse 107  Temp(Src) 98.3 F (36.8 C) (Oral)  Resp 18  Ht 5\' 5"  (1.651 m)  Wt 210 lb (95.255 kg)  BMI 34.95 kg/m2  SpO2 98%  Physical Exam    Assessment & Plan:   Veneda was seen today for arm pain, chills, dizziness and fatigue.  Diagnoses and all orders for this visit:  Left arm pain Orders: -     DG Elbow Complete Left; Future  Malaise Orders: -     POCT urinalysis dipstick -     POCT UA - Microscopic Only -     POCT CBC  Other orders -     naproxen sodium (ANAPROX DS) 550 MG tablet; Take 1 tablet (550 mg total) by mouth 2 (two) times daily with a meal.   I am having Ms. Crace start on naproxen sodium. I am also having her maintain her multivitamin with minerals, Omega-3 Fatty Acids (OMEGA 3 PO), ALOE VERA PO, nitroGLYCERIN, aspirin, ticagrelor, atorvastatin, metoprolol succinate, Coenzyme Q10 (Q-10 CO-ENZYME PO), Iron-Vitamins (VITALIZE PO), HYDROcodone-acetaminophen, predniSONE, and gabapentin.  Meds ordered this encounter  Medications  . gabapentin (NEURONTIN) 300 MG capsule    Sig: Take 300 mg by mouth 3 (three) times daily.  . naproxen sodium (ANAPROX DS) 550 MG tablet    Sig: Take 1 tablet (550 mg total) by mouth 2 (two) times daily with a meal.    Dispense:  60 tablet    Refill:  0    Appropriate red flag conditions were discussed with the patient as well as actions that should be taken.  Patient expressed his understanding.  Follow-up: Return if symptoms worsen or fail to improve.  Roselee Culver, MD    [facility] reading (PRIMARY) by  Dr. Ouida Sills.  negative.    Results for orders placed or performed in visit on 12/29/14  POCT urinalysis dipstick  Result Value Ref Range   Color, UA yellow    Clarity, UA clear    Glucose, UA neg    Bilirubin, UA neg    Ketones, UA neg    Spec Grav, UA 1.010    Blood, UA neg    pH, UA 7.0    Protein, UA neg     Urobilinogen, UA 0.2    Nitrite, UA neg    Leukocytes, UA Negative Negative  POCT UA - Microscopic Only  Result Value Ref Range   WBC, Ur, HPF, POC neg    RBC, urine, microscopic neg    Bacteria, U Microscopic neg    Mucus, UA neg    Epithelial cells, urine per micros neg    Crystals, Ur, HPF, POC neg    Casts, Ur, LPF,  POC neg    Yeast, UA neg   POCT CBC  Result Value Ref Range   WBC 4.0 (A) 4.6 - 10.2 K/uL   Lymph, poc 2.1 0.6 - 3.4   POC LYMPH PERCENT 51.8 (A) 10 - 50 %L   MID (cbc) 0.2 0 - 0.9   POC MID % 4.7 0 - 12 %M   POC Granulocyte 1.7 (A) 2 - 6.9   Granulocyte percent 43.5 37 - 80 %G   RBC 4.35 4.04 - 5.48 M/uL   Hemoglobin 12.9 12.2 - 16.2 g/dL   HCT, POC 39.0 37.7 - 47.9 %   MCV 89.8 80 - 97 fL   MCH, POC 29.7 27 - 31.2 pg   MCHC 33.1 31.8 - 35.4 g/dL   RDW, POC 14.1 %   Platelet Count, POC 124 (A) 142 - 424 K/uL   MPV 8.5 0 - 99.8 fL   [facility] reading (PRIMARY) by  Dr. Ouida Sills.  negative.

## 2014-12-30 IMAGING — CR DG CHEST 2V
2 series · 2 of 2 positions shown · non-contrast
Comparison: 08/24/2011

CLINICAL DATA: Left chest and arm pain.  Shortness of breath.

EXAM:
CHEST  2 VIEW

[w chest pa]
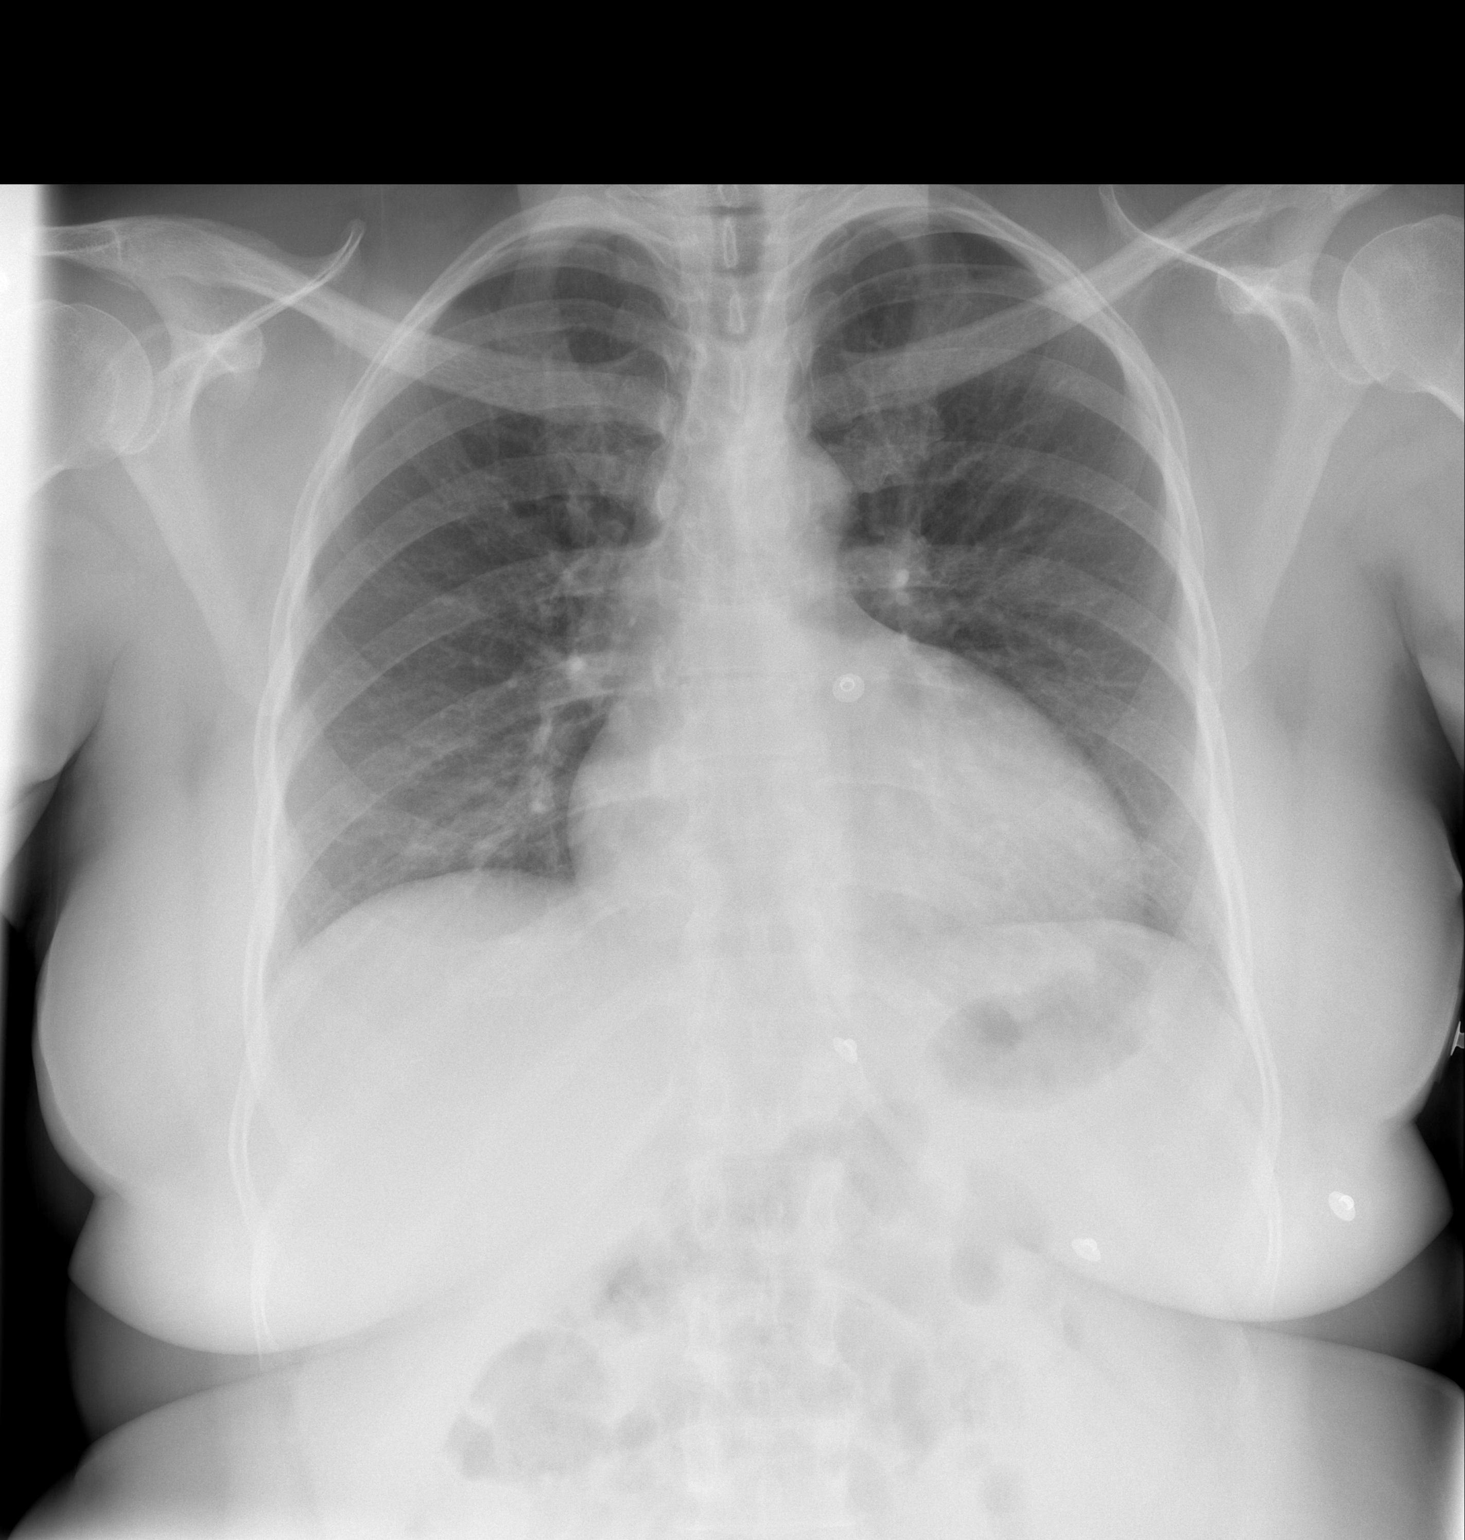

[w chest lat]
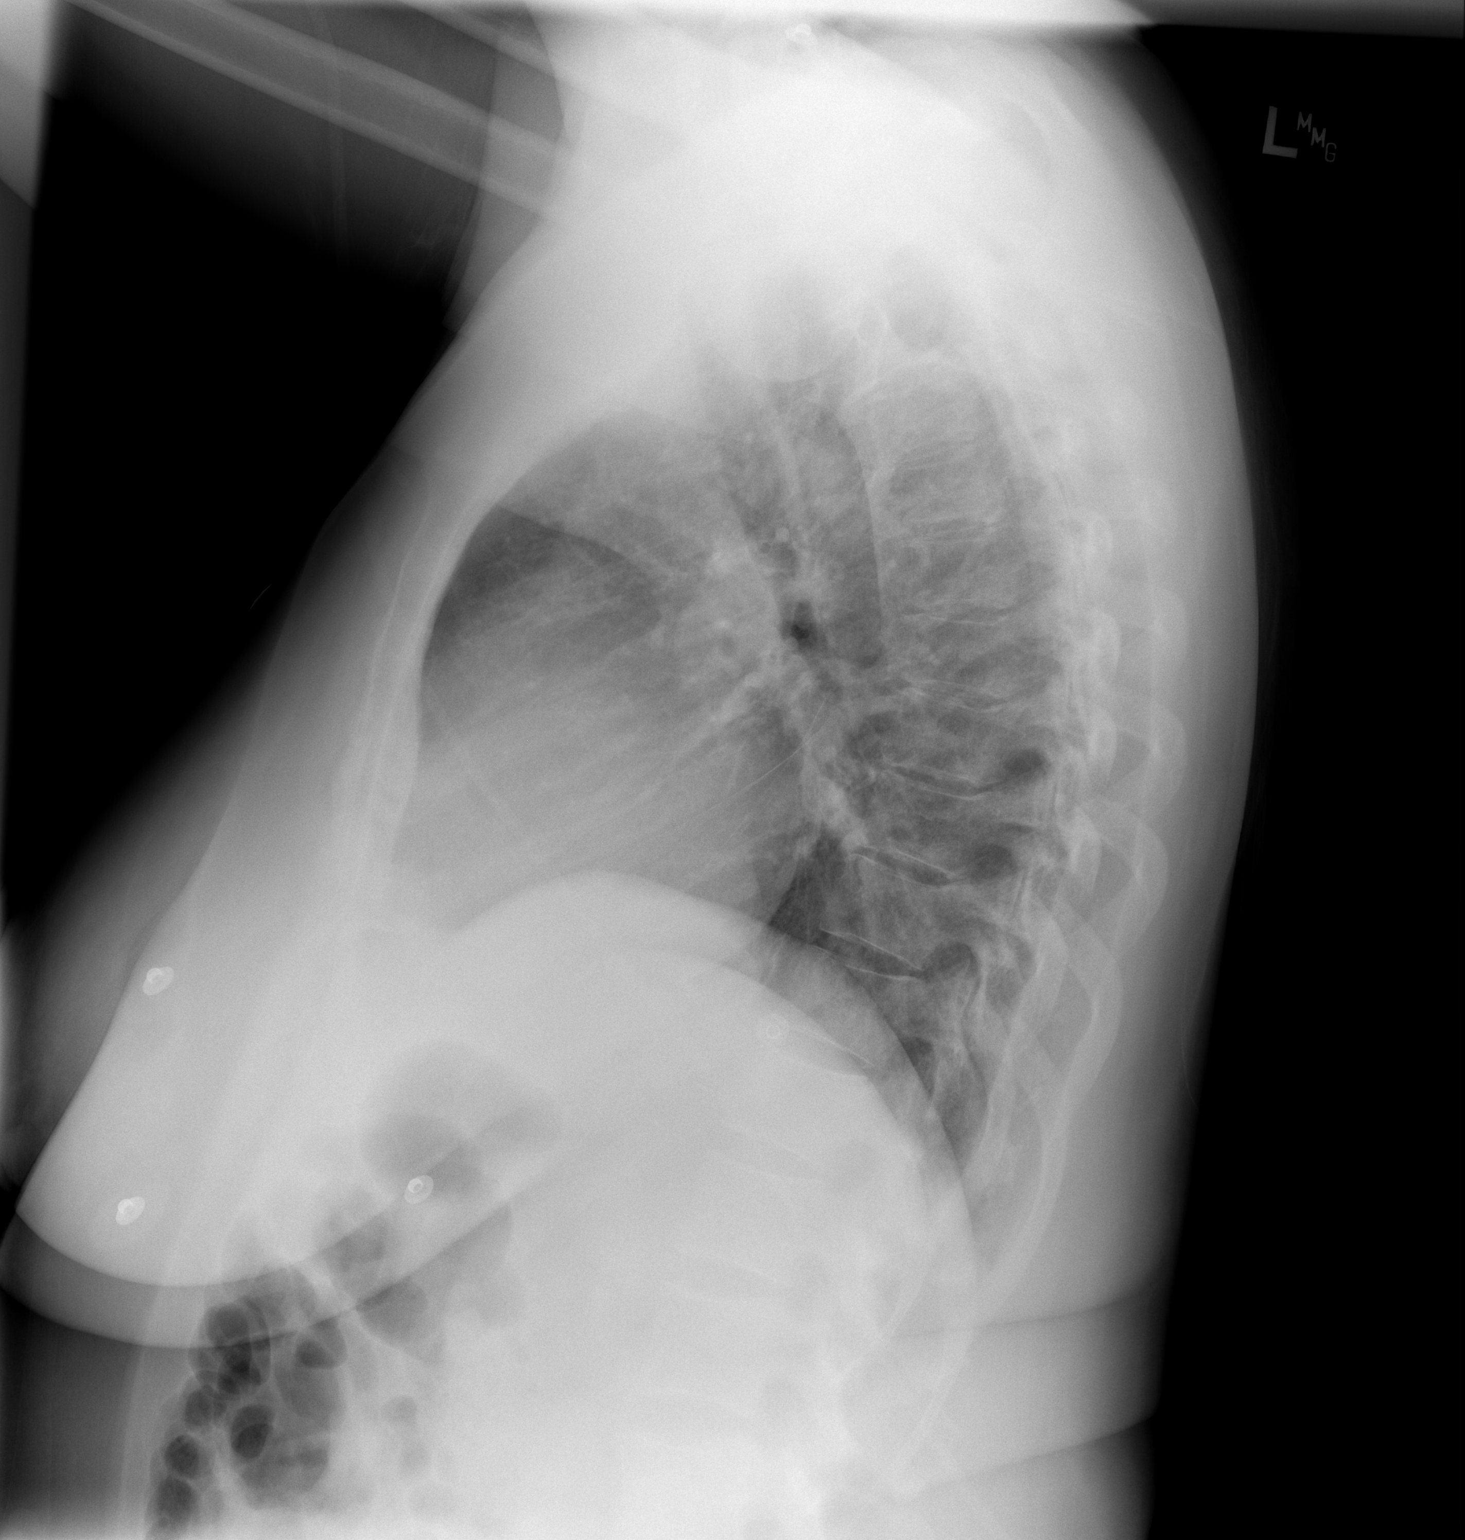

[2 of 2 positions shown; findings below may reference images not displayed]

FINDINGS: Poor inspiration with no gross change in borderline enlargement of
the cardiac silhouette. Clear lungs with normal vascularity.
Interval mild diffuse peribronchial thickening. Unremarkable bones.
IMPRESSION: 1. Interval mild bronchitic changes.
2. Stable borderline cardiomegaly

## 2015-01-16 ENCOUNTER — Ambulatory Visit (INDEPENDENT_AMBULATORY_CARE_PROVIDER_SITE_OTHER): Payer: BC Managed Care – PPO | Admitting: Emergency Medicine

## 2015-01-16 VITALS — BP 124/70 | HR 89 | Temp 98.4°F | Resp 18 | Ht 65.5 in | Wt 207.8 lb

## 2015-01-16 DIAGNOSIS — M7712 Lateral epicondylitis, left elbow: Secondary | ICD-10-CM

## 2015-01-16 MED ORDER — METHYLPREDNISOLONE ACETATE 80 MG/ML IJ SUSP: 80.0000 mg | Freq: Once | INTRAMUSCULAR | Status: DC

## 2015-01-16 NOTE — Patient Instructions (Signed)

## 2015-01-16 NOTE — Progress Notes (Signed)
Subjective:  Patient ID: Emma Combs, female    DOB: [birth year]  Age: 59 y.o. MRN: [id number]  CC: Follow-up   HPI Emma Combs presents  for recheck of her left elbow. She has a recent visit with left forearm pain. She's been using a tennis elbow support with no improvement. She did have resolution of her pain washes taking the Anaprox but stopped it and has had recurrent recurrence of her pain. No good reason for stopping the Anaprox. She has no history of injury or overuse  History Emma Combs has a past medical history of Glucose intolerance (impaired glucose tolerance); Obesity; Coronary atherosclerosis of native coronary artery; Borderline diabetes mellitus; High cholesterol; Anginal pain; Uterine fibroid; and Arthritis.   She has past surgical history that includes Hemorrhoid banding (2009); TEE without cardioversion (N/A, 09/06/2013); Coronary angioplasty with stent (08/11/2013; 02/28/2014); Tubal ligation (1992); left and right heart catheterization with coronary angiogram (N/A, 08/11/2013); left heart catheterization with coronary angiogram (N/A, 02/28/2014); and percutaneous coronary stent intervention (pci-s) (02/28/2014).   Her  family history includes Cancer in her mother and sister; Diabetes in her brother, father, mother, and sister; Hyperlipidemia in her brother, father, mother, and sister; Hypertension in her brother and sister; Stroke in her father.  She   reports that she has never smoked. She has never used smokeless tobacco. She reports that she drinks about 4.2 oz of alcohol per week. She reports that she does not use illicit drugs.  Outpatient Prescriptions Prior to Visit  Medication Sig Dispense Refill  . ALOE VERA PO Take 8 oz by mouth daily.    Marland Kitchen aspirin 81 MG chewable tablet Chew 1 tablet (81 mg total) by mouth daily.    Marland Kitchen atorvastatin (LIPITOR) 80 MG tablet Take 1 tablet (80 mg total) by mouth daily at 6 PM. 30 tablet 3  . Coenzyme Q10 (Q-10 CO-ENZYME PO) Take 1  packet by mouth daily.    Marland Kitchen gabapentin (NEURONTIN) 300 MG capsule Take 300 mg by mouth 3 (three) times daily.    Marland Kitchen HYDROcodone-acetaminophen (NORCO) 5-325 MG per tablet Take 1 tablet by mouth every 6 (six) hours as needed. 20 tablet 0  . Iron-Vitamins (VITALIZE PO) Take 2 tablets by mouth daily.    . metoprolol succinate (TOPROL-XL) 50 MG 24 hr tablet Take 1 tablet (50 mg total) by mouth daily. 30 tablet 1  . Multiple Vitamin (MULTIVITAMIN WITH MINERALS) TABS tablet Take 2 tablets by mouth daily.    . naproxen sodium (ANAPROX DS) 550 MG tablet Take 1 tablet (550 mg total) by mouth 2 (two) times daily with a meal. 60 tablet 0  . nitroGLYCERIN (NITROSTAT) 0.4 MG SL tablet Place 0.4 mg under the tongue every 5 (five) minutes as needed for chest pain.    . Omega-3 Fatty Acids (OMEGA 3 PO) Take 2 capsules by mouth daily with breakfast.    . Ticagrelor (BRILINTA) 90 MG TABS tablet Take 1 tablet (90 mg total) by mouth 2 (two) times daily. 60 tablet 0  . predniSONE (DELTASONE) 20 MG tablet 3 by mouth for 3 days, then 2 by mouth for 2 days, then 1 by mouth for 2 days, then 1/2 by mouth for 2 days. (Patient not taking: Reported on 12/29/2014) 16 tablet 0   No facility-administered medications prior to visit.    History   Social History  . Marital Status: Married    Spouse Name: N/A  . Number of Children: 5  . Years of Education: N/A   Occupational  History  .     Social History Main Topics  . Smoking status: Never Smoker   . Smokeless tobacco: Never Used  . Alcohol Use: 4.2 oz/week    7 Glasses of wine per week  . Drug Use: No  . Sexual Activity: Yes   Other Topics Concern  . None   Social History Narrative   Marital status: married      Children: five children.       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      Exercise: none     Review of Systems  Constitutional: Negative for fever, chills and appetite change.  HENT:  Negative for congestion, ear pain, postnasal drip, sinus pressure and sore throat.   Eyes: Negative for pain and redness.  Respiratory: Negative for cough, shortness of breath and wheezing.   Cardiovascular: Negative for leg swelling.  Gastrointestinal: Negative for nausea, vomiting, abdominal pain, diarrhea, constipation and blood in stool.  Endocrine: Negative for polyuria.  Genitourinary: Negative for dysuria, urgency, frequency and flank pain.  Musculoskeletal: Negative for gait problem.  Skin: Negative for rash.  Neurological: Negative for weakness and headaches.  Psychiatric/Behavioral: Negative for confusion and decreased concentration. The patient is not nervous/anxious.     Objective:  BP 124/70 mmHg  Pulse 89  Temp(Src) 98.4 F (36.9 C) (Oral)  Resp 18  Ht 5' 5.5" (1.664 m)  Wt 207 lb 12.8 oz (94.257 kg)  BMI 34.04 kg/m2  SpO2 98%  Physical Exam  Constitutional: She is oriented to person, place, and time. She appears well-developed and well-nourished.  HENT:  Head: Normocephalic and atraumatic.  Eyes: Conjunctivae are normal. Pupils are equal, round, and reactive to light.  Pulmonary/Chest: Effort normal.  Musculoskeletal: She exhibits no edema.       Left elbow: Tenderness found. Lateral epicondyle tenderness noted.  Neurological: She is alert and oriented to person, place, and time.  Skin: Skin is dry.  Psychiatric: She has a normal mood and affect. Her behavior is normal. Thought content normal.   she has marked localized tenderness in the lateral epicondyle consistent with tennis elbow  Assessment & Plan:   Emma Combs was seen today for follow-up.  Diagnoses and all orders for this visit:  Tennis elbow syndrome, left Orders: -     methylPREDNISolone acetate (DEPO-MEDROL) injection 80 mg; Inject 1 mL (80 mg total) into the muscle once.   I am having Emma Combs maintain her multivitamin with minerals, Omega-3 Fatty Acids (OMEGA 3 PO), ALOE VERA PO,  nitroGLYCERIN, aspirin, ticagrelor, atorvastatin, metoprolol succinate, Coenzyme Q10 (Q-10 CO-ENZYME PO), Iron-Vitamins (VITALIZE PO), HYDROcodone-acetaminophen, predniSONE, gabapentin, and naproxen sodium. We will continue to administer methylPREDNISolone acetate.  Meds ordered this encounter  Medications  . methylPREDNISolone acetate (DEPO-MEDROL) injection 80 mg    Sig:    Trigger point was injected with Depo-Medrol and Marcaine she'll follow-up and  Appropriate red flag conditions were discussed with the patient as well as actions that should be taken.  Patient expressed his understanding.  Follow-up: No Follow-up on file.  Roselee Culver, MD

## 2015-05-12 ENCOUNTER — Ambulatory Visit (INDEPENDENT_AMBULATORY_CARE_PROVIDER_SITE_OTHER): Payer: BC Managed Care – PPO | Admitting: Family Medicine

## 2015-05-12 VITALS — BP 104/60 | HR 76 | Temp 98.7°F | Resp 16 | Ht 65.5 in | Wt 208.8 lb

## 2015-05-12 DIAGNOSIS — H8111 Benign paroxysmal vertigo, right ear: Secondary | ICD-10-CM | POA: Diagnosis not present

## 2015-05-12 MED ORDER — MECLIZINE HCL 25 MG PO TABS: 25.0000 mg | ORAL_TABLET | Freq: Three times a day (TID) | ORAL | Status: DC | PRN

## 2015-05-12 NOTE — Patient Instructions (Signed)
You are having vertigo- this is the spinning sensation that you have noticed.  I think that you have a type of vertigo known as BPPV- this is not dangerous and should pass Use the meclizine as needed to help suppress the dizziness but remember this can make you feel sleepy Let me know if you are not feeling better in the next few days- Sooner if worse.

## 2015-05-12 NOTE — Progress Notes (Signed)
[facility] [address] [place] [address] [phone]  Date:  05/12/2015   Name:  Emma Combs   DOB:  07-29-1955   MRN:  [id number]  PCP:  Ruben Reason, MD    Chief Complaint: Dizziness and Fatigue   History of Present Illness:  Emma Combs is a 59 y.o. very pleasant female patient who presents with the following:  She is here today with vertigo- she notes a spinning sensation when she rolls over in bed, moves or turns her head.  This has been present for the last 2 days or so No HA , no vision change No tinnitus. She has felt tired but she has been taking care of the whole extended family over the recent holiday- she was not tired prior to this.  She notes that she has had the vertigo off an on for the last 6 months or so but it has never lasted Also for a week or so she has noted an intermittent sensation of blood rushing up her left leg.  It will hurt and she will feel like she needs to shake her leg.  It may last for 5 minutes or so.  Never had a blood clot. 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 Coronary atherosclerosis of native coronary artery 08/12/2013  . S/P PTCA (percutaneous transluminal coronary angioplasty) 08/11/2013  . Obesity 08/24/2011  . Glucose intolerance (impaired glucose tolerance) 08/24/2011    Past Medical History  Diagnosis Date  . Glucose intolerance (impaired glucose tolerance)   . Obesity   . Coronary atherosclerosis of native coronary artery     08/12/2013: PTCA and stenting of the proximal and mid LAD with overlapping 3.0 x 28 mm in the proximal to mid segment and the ostial 3.0 x 12 mm Promus Premier drug-eluting stent. 02/28/2014: Stent distal RCA 2.75x15 Xience Alpine DES. Mod disease in Ramus intermediate. EF 55%  . Borderline diabetes mellitus   . High cholesterol   . Anginal pain ([place])   . Uterine fibroid   . Arthritis     "joints ache;  hips" (02/28/2014)    Past Surgical History  Procedure Laterality Date  . Hemorrhoid banding  2009  . Tee without cardioversion N/A 09/06/2013    Procedure: TRANSESOPHAGEAL ECHOCARDIOGRAM (TEE);  Surgeon: Laverda Page, MD;  Location: [place];  Service: Cardiovascular;  Laterality: N/A;  . Coronary angioplasty with stent placement  08/11/2013; 02/28/2014    "2 + 1"  . Tubal ligation  1992  . Left and right heart catheterization with coronary angiogram N/A 08/11/2013    Procedure: LEFT AND RIGHT HEART CATHETERIZATION WITH CORONARY ANGIOGRAM;  Surgeon: Laverda Page, MD;  Location: [facility] CATH LAB;  Service: Cardiovascular;  Laterality: N/A;  . Left heart catheterization with coronary angiogram N/A 02/28/2014    Procedure: LEFT HEART CATHETERIZATION WITH CORONARY ANGIOGRAM;  Surgeon: Laverda Page, MD;  Location: [facility] CATH LAB;  Service: Cardiovascular;  Laterality: N/A;  . Percutaneous coronary stent intervention (pci-s)  02/28/2014    Procedure: PERCUTANEOUS CORONARY STENT INTERVENTION (PCI-S);  Surgeon: Laverda Page, MD;  Location: [facility] CATH LAB;  Service: Cardiovascular;;  2.75 x 68mm Xience DEstent distal RCA    Social History  Substance Use Topics  . Smoking status: Never Smoker   . Smokeless tobacco: Never Used  . Alcohol Use: 4.2 oz/week    7 Glasses of wine per  week    Family History  Problem Relation Age of Onset  . Hyperlipidemia Mother   . Cancer Mother     leukemia  . Diabetes Mother   . Diabetes Father   . Hyperlipidemia Father   . Stroke Father   . Cancer Sister   . Diabetes Sister   . Hyperlipidemia Sister   . Hypertension Sister   . Hypertension Brother   . Hyperlipidemia Brother   . Diabetes Brother     Allergies  Allergen Reactions  . Bactrim Other (See Comments)    Light headed, passing out    Medication list has been reviewed and updated.  Current Outpatient Prescriptions on File Prior to Visit  Medication Sig Dispense Refill  . ALOE  VERA PO Take 8 oz by mouth daily.    Marland Kitchen aspirin 81 MG chewable tablet Chew 1 tablet (81 mg total) by mouth daily.    Marland Kitchen atorvastatin (LIPITOR) 80 MG tablet Take 1 tablet (80 mg total) by mouth daily at 6 PM. 30 tablet 3  . Coenzyme Q10 (Q-10 CO-ENZYME PO) Take 1 packet by mouth daily.    Marland Kitchen gabapentin (NEURONTIN) 300 MG capsule Take 300 mg by mouth 3 (three) times daily.    . Iron-Vitamins (VITALIZE PO) Take 2 tablets by mouth daily.    . metoprolol succinate (TOPROL-XL) 50 MG 24 hr tablet Take 1 tablet (50 mg total) by mouth daily. 30 tablet 1  . Multiple Vitamin (MULTIVITAMIN WITH MINERALS) TABS tablet Take 2 tablets by mouth daily.    . nitroGLYCERIN (NITROSTAT) 0.4 MG SL tablet Place 0.4 mg under the tongue every 5 (five) minutes as needed for chest pain.    . Omega-3 Fatty Acids (OMEGA 3 PO) Take 2 capsules by mouth daily with breakfast.    . Ticagrelor (BRILINTA) 90 MG TABS tablet Take 1 tablet (90 mg total) by mouth 2 (two) times daily. 60 tablet 0  . HYDROcodone-acetaminophen (NORCO) 5-325 MG per tablet Take 1 tablet by mouth every 6 (six) hours as needed. (Patient not taking: Reported on 05/12/2015) 20 tablet 0  . naproxen sodium (ANAPROX DS) 550 MG tablet Take 1 tablet (550 mg total) by mouth 2 (two) times daily with a meal. (Patient not taking: Reported on 05/12/2015) 60 tablet 0  . predniSONE (DELTASONE) 20 MG tablet 3 by mouth for 3 days, then 2 by mouth for 2 days, then 1 by mouth for 2 days, then 1/2 by mouth for 2 days. (Patient not taking: Reported on 05/12/2015) 16 tablet 0   Current Facility-Administered Medications on File Prior to Visit  Medication Dose Route Frequency Provider Last Rate Last Dose  . methylPREDNISolone acetate (DEPO-MEDROL) injection 80 mg  80 mg Intramuscular Once Roselee Culver, MD        Review of Systems:  As per HPI- otherwise negative.   Physical Examination: Filed Vitals:   05/12/15 1534  BP: 104/60  Pulse: 76  Temp: 98.7 F (37.1 C)   Resp: 16   Filed Vitals:   05/12/15 1534  Height: 5' 5.5" (1.664 m)  Weight: 208 lb 12.8 oz (94.711 kg)   Body mass index is 34.21 kg/(m^2). Ideal Body Weight: Weight in (lb) to have BMI = 25: 152.2  GEN: WDWN, NAD, Non-toxic, A & O x 3, obese, looks well HEENT: Atraumatic, Normocephalic. Neck supple. No masses, No LAD.  Bilateral TM wnl, oropharynx normal.  PEERL,EOMI.   Ears and Nose: No external deformity. CV: RRR, No M/G/R. No JVD. No  thrill. No extra heart sounds. PULM: CTA B, no wheezes, crackles, rhonchi. No retractions. No resp. distress. No accessory muscle use. ABD: S, NT, ND, +BS. No rebound. No HSM. EXTR: No c/c/e NEURO Normal gait. Normal strength and DTR of all extremities, negative romberg dix-halpike is quite positive on the right. Did not test the left  PSYCH: Normally interactive. Conversant. Not depressed or anxious appearing.  Calm demeanor.  No swelling, tenderness or other sx on exam of the left leg  BP Readings from Last 3 Encounters:  05/12/15 104/60  01/16/15 124/70  12/29/14 116/70     Assessment and Plan: BPPV (benign paroxysmal positional vertigo), right - Plan: meclizine (ANTIVERT) 25 MG tablet  Here today with BPPV sx.  Discussed with pt and reassured.   She will use meclizine as needed for her sx Follow- up if not better in a few days  Signed Lamar Blinks, MD
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
## 2015-10-12 IMAGING — CR DG CERVICAL SPINE 2 OR 3 VIEWS
2 series · 2 of 2 positions shown · non-contrast
Comparison: None.

CLINICAL DATA: Right arm pain and numbness 2 weeks.  Neck pain.

EXAM:
CERVICAL SPINE - 2-3 VIEW

[lateral]
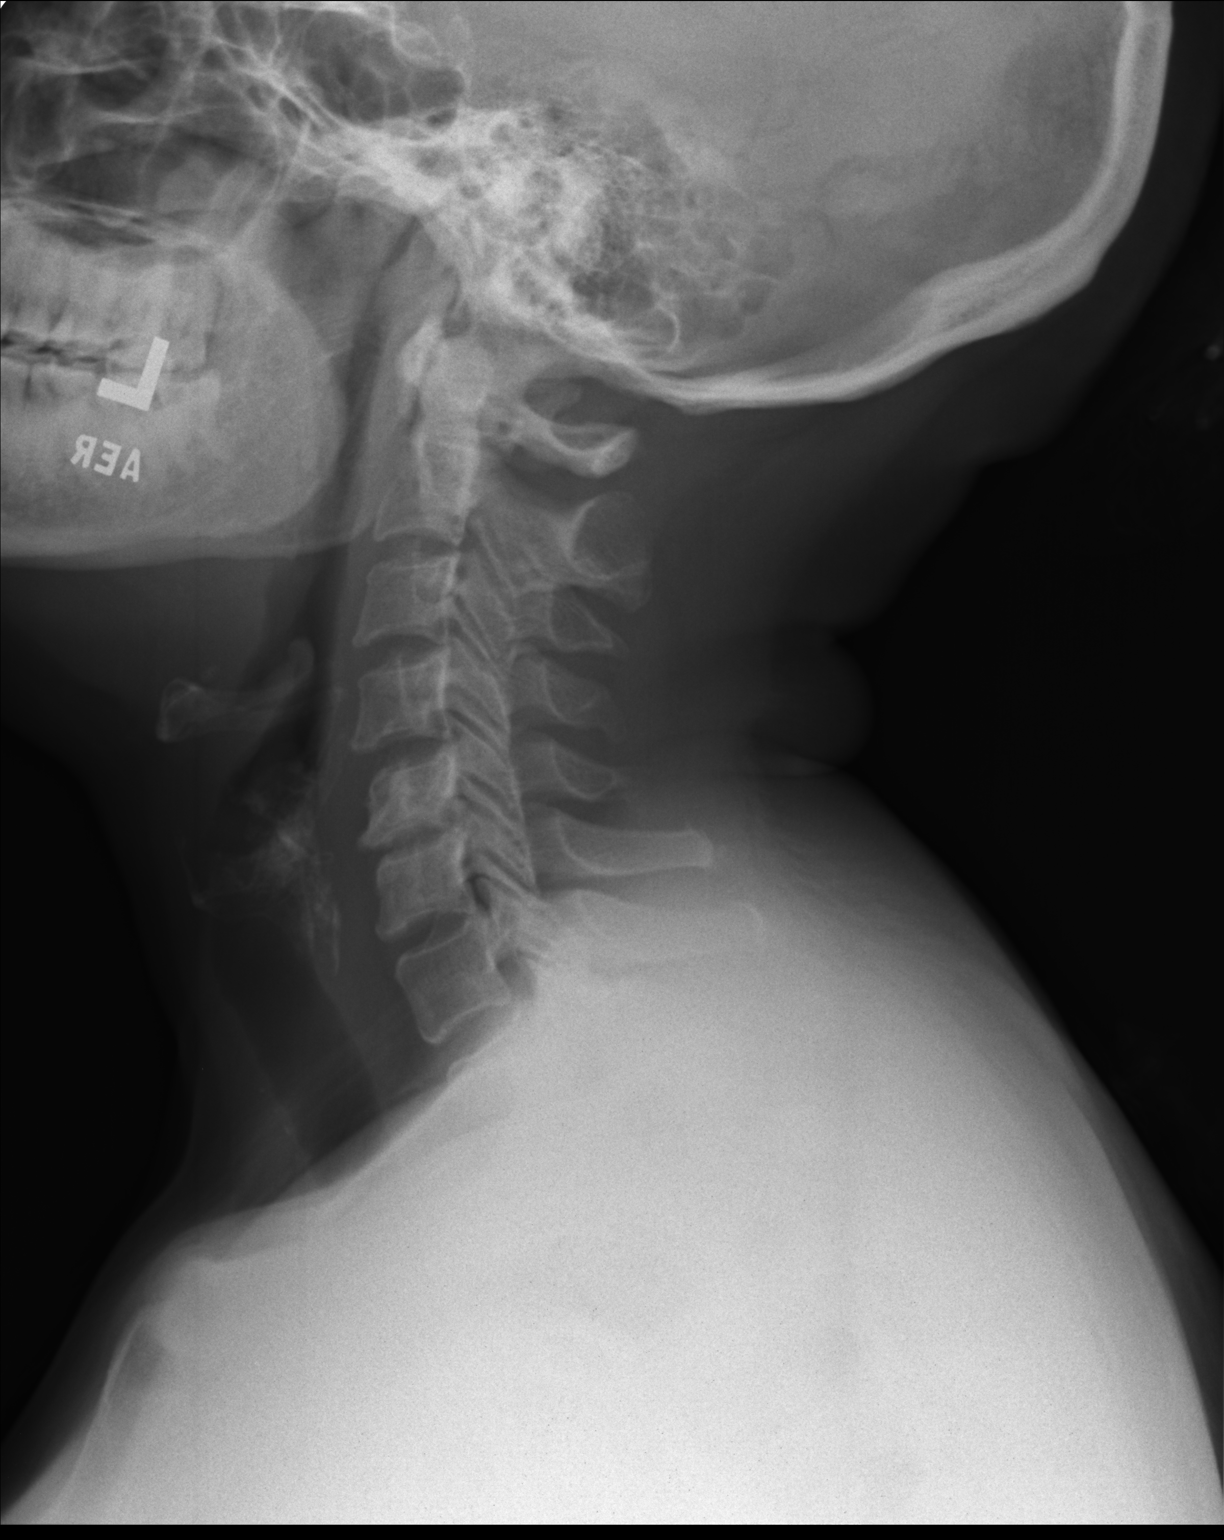

[AP]
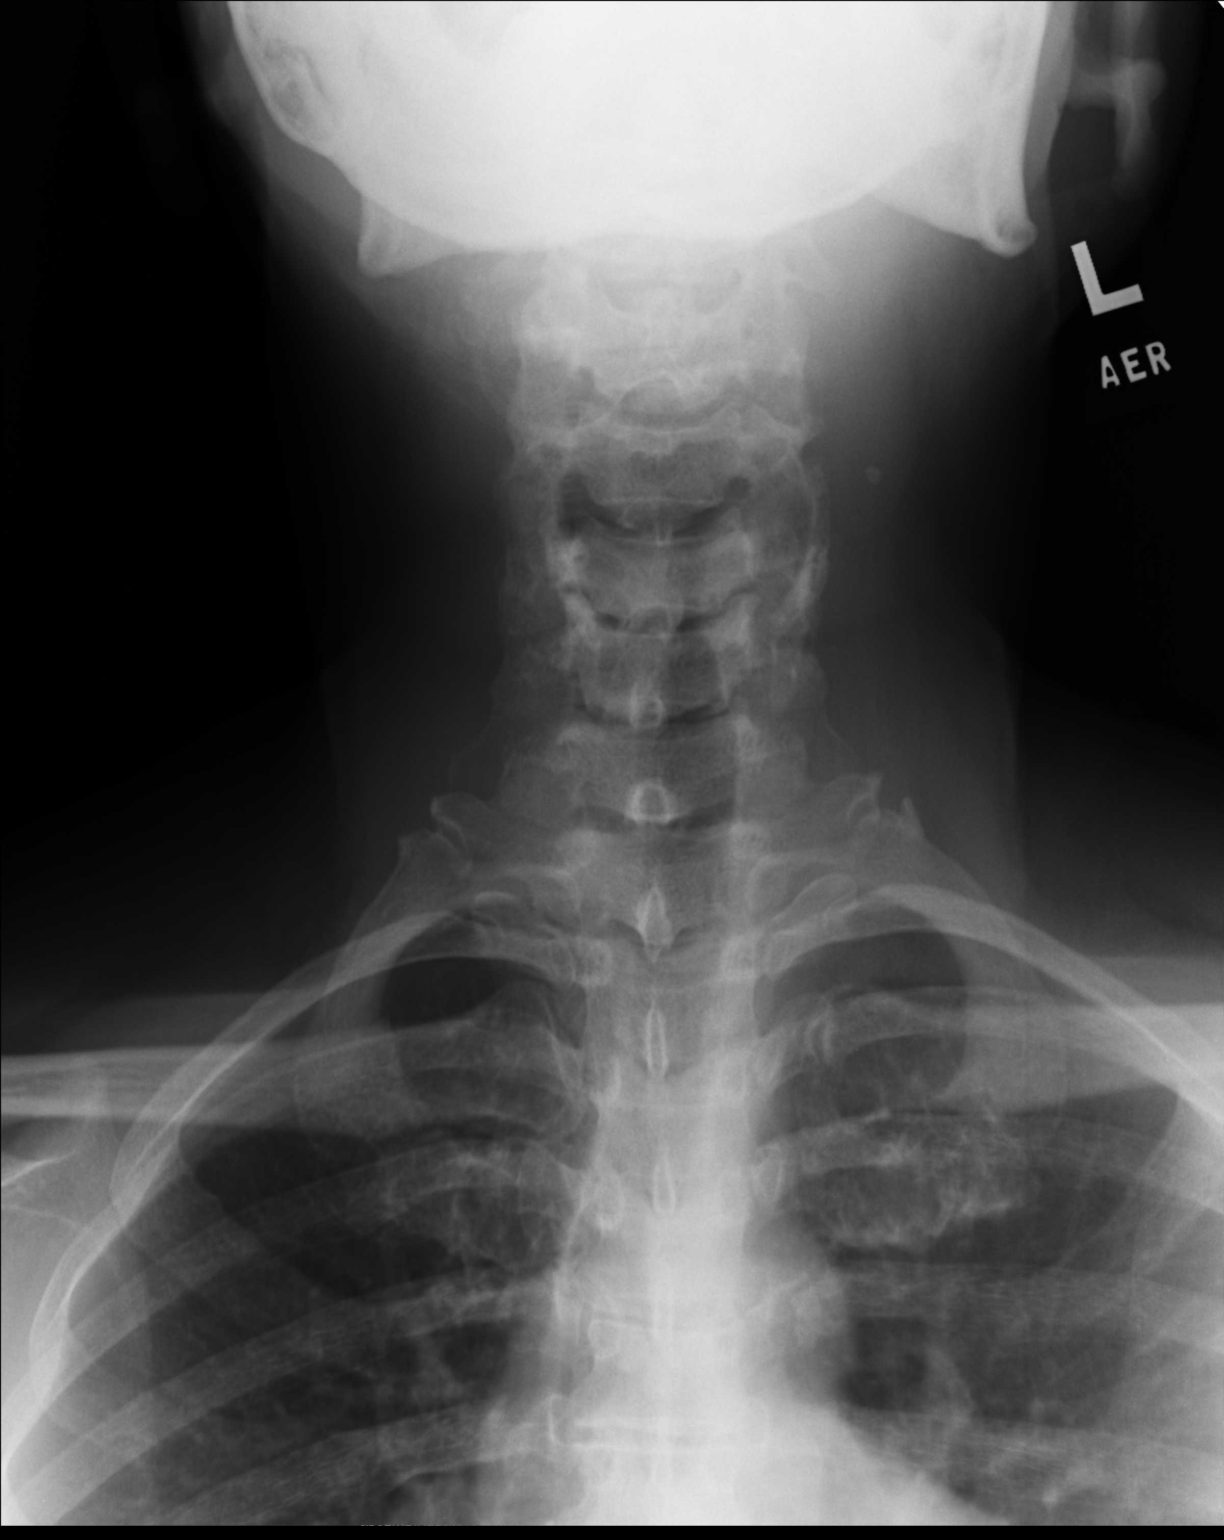

[2 of 2 positions shown; findings below may reference images not displayed]

FINDINGS: Vertebral body alignment and heights are normal. There is mild
spondylosis throughout the cervical spine. There is mild disc space
narrowing at the C5-6 level. Prevertebral soft tissues are within
normal. There is uncovertebral joint spurring.
IMPRESSION: Mild spondylosis of the cervical spine with disc disease at the C5-6
level.

## 2015-12-17 ENCOUNTER — Telehealth: Payer: Self-pay

## 2015-12-17 NOTE — Telephone Encounter (Signed)
NEEDS REFERRAL FOR COLONOSCOPY  - DR. PETERS IN [place]  ALSO HAS HEMMORRIDS  REQUESTING ANUCORT-HC 25MG  RECTAL SUPP 24'S  [facility]  [phone]

## 2015-12-19 ENCOUNTER — Ambulatory Visit (INDEPENDENT_AMBULATORY_CARE_PROVIDER_SITE_OTHER): Payer: BC Managed Care – PPO | Admitting: Family Medicine

## 2015-12-19 VITALS — BP 120/70 | HR 81 | Temp 97.9°F | Resp 18 | Ht 65.5 in | Wt 212.0 lb

## 2015-12-19 DIAGNOSIS — R51 Headache: Secondary | ICD-10-CM

## 2015-12-19 DIAGNOSIS — K649 Unspecified hemorrhoids: Secondary | ICD-10-CM

## 2015-12-19 DIAGNOSIS — Z1211 Encounter for screening for malignant neoplasm of colon: Secondary | ICD-10-CM | POA: Diagnosis not present

## 2015-12-19 DIAGNOSIS — R7309 Other abnormal glucose: Secondary | ICD-10-CM

## 2015-12-19 DIAGNOSIS — R7303 Prediabetes: Secondary | ICD-10-CM | POA: Diagnosis not present

## 2015-12-19 DIAGNOSIS — H5712 Ocular pain, left eye: Secondary | ICD-10-CM

## 2015-12-19 DIAGNOSIS — M2550 Pain in unspecified joint: Secondary | ICD-10-CM | POA: Diagnosis not present

## 2015-12-19 DIAGNOSIS — R519 Headache, unspecified: Secondary | ICD-10-CM

## 2015-12-19 LAB — COMPREHENSIVE METABOLIC PANEL
ALT: 16 U/L (ref 6–29)
AST: 21 U/L (ref 10–35)
Albumin: 4.5 g/dL (ref 3.6–5.1)
Alkaline Phosphatase: 50 U/L (ref 33–130)
BILIRUBIN TOTAL: 0.7 mg/dL (ref 0.2–1.2)
BUN: 11 mg/dL (ref 7–25)
CALCIUM: 10 mg/dL (ref 8.6–10.4)
CO2: 28 mmol/L (ref 20–31)
Chloride: 104 mmol/L (ref 98–110)
Creat: 0.78 mg/dL (ref 0.50–1.05)
GLUCOSE: 118 mg/dL — AB (ref 65–99)
POTASSIUM: 4.6 mmol/L (ref 3.5–5.3)
Sodium: 142 mmol/L (ref 135–146)
Total Protein: 7.3 g/dL (ref 6.1–8.1)

## 2015-12-19 LAB — GLUCOSE, POCT (MANUAL RESULT ENTRY): POC Glucose: 122 mg/dl — AB (ref 70–99)

## 2015-12-19 LAB — POC MICROSCOPIC URINALYSIS (UMFC): MUCUS RE: ABSENT

## 2015-12-19 LAB — POCT CBC
Granulocyte percent: 41.2 %G (ref 37–80)
HCT, POC: 40.3 % (ref 37.7–47.9)
HEMOGLOBIN: 13.8 g/dL (ref 12.2–16.2)
Lymph, poc: 2.1 (ref 0.6–3.4)
MCH: 30 pg (ref 27–31.2)
MCHC: 34.1 g/dL (ref 31.8–35.4)
MCV: 88.1 fL (ref 80–97)
MID (cbc): 0.2 (ref 0–0.9)
MPV: 7.6 fL (ref 0–99.8)
POC Granulocyte: 1.6 — AB (ref 2–6.9)
POC LYMPH PERCENT: 52.6 %L — AB (ref 10–50)
POC MID %: 6.2 % (ref 0–12)
Platelet Count, POC: 118 10*3/uL — AB (ref 142–424)
RBC: 4.58 M/uL (ref 4.04–5.48)
RDW, POC: 13.9 %
WBC: 4 10*3/uL — AB (ref 4.6–10.2)

## 2015-12-19 LAB — POCT GLYCOSYLATED HEMOGLOBIN (HGB A1C): Hemoglobin A1C: 6.5

## 2015-12-19 LAB — TSH: TSH: 2.68 mIU/L

## 2015-12-19 LAB — POCT SEDIMENTATION RATE: POCT SED RATE: 12 mm/hr (ref 0–22)

## 2015-12-19 LAB — C-REACTIVE PROTEIN

## 2015-12-19 MED ORDER — KETOROLAC TROMETHAMINE 60 MG/2ML IM SOLN
60.0000 mg | Freq: Once | INTRAMUSCULAR | Status: AC
  Administered 2015-12-19: 60 mg via INTRAMUSCULAR

## 2015-12-19 MED ORDER — BUTALBITAL-APAP-CAFFEINE 50-325-40 MG PO TABS: 1.0000 | ORAL_TABLET | Freq: Four times a day (QID) | ORAL | Status: DC | PRN

## 2015-12-19 MED ORDER — HYDROCORTISONE ACETATE 25 MG RE SUPP: 25.0000 mg | Freq: Two times a day (BID) | RECTAL | Status: AC | PRN

## 2015-12-19 MED ORDER — PROMETHAZINE HCL 25 MG/ML IJ SOLN
25.0000 mg | Freq: Once | INTRAMUSCULAR | Status: AC
  Administered 2015-12-19: 25 mg via INTRAMUSCULAR

## 2015-12-19 NOTE — Telephone Encounter (Signed)
Pt is here to be seen now.

## 2015-12-19 NOTE — Patient Instructions (Addendum)
I do not know if your headaches are tension headaches - originating from your neck/base of your skull and radiating up to your eye, if they are cluster headaches - a variant of a migraine that can result in the eye/nose watering on the side of the temple that the headache is on, or in worst case scenario giant cell (or temporal) arteritis.  The lab tests today will help [place] rule out the latter which is important as it can result in permanent vision loss if missed. How you respond to the treatments and the lab results will allow [place] to differentiate more between the most likely etiologies.  We may need specialist consult and may want to get an MRI of your head.  I went and ordered all the specialists - thought it would be best to get things scheduled asap and then be able to cancel them if you are doing well and we find out what is going on and how to fix it.  I will plan to call you later today to see how you responded and review the lab tests.   IF you received an x-ray today, you will receive an invoice from [facility]. Please contact [facility] at [phone] with questions or concerns regarding your invoice.   IF you received labwork today, you will receive an invoice from [employer]. Please contact [facility] at [phone] with questions or concerns regarding your invoice.   Our billing staff will not be able to assist you with questions regarding bills from these companies.  You will be contacted with the lab results as soon as they are available. The fastest way to get your results is to activate your My Chart account. Instructions are located on the last page of this paperwork. If you have not heard from [place] regarding the results in 2 weeks, please contact this office.    Temporal Arteritis Temporal arteritis, also called giant cell arteritis, is a condition that causes arteries to become swollen (inflamed). It usually affects arteries in your head  and face, but arteries in any part of the body can become inflamed. Temporal arteritis can cause serious problems, such as bone loss, diabetes, and blindness. CAUSES  The cause is unknown. RISK FACTORS  Being older than 33.  Being a woman.  Being Caucasian.  Being of [place], [place], [place], [place] or [place] ancestry.  Having polymyalgia rheumatica (PMR). SIGNS AND SYMPTOMS  Some people with temporal arteritis have just one symptom, while other have several symptoms. Most signs and symptoms are related to the head and face. Signs and symptoms may include:  Hard or swollen temples (common). Your temples are the flattened area on either side of your forehead. If your temples are swollen, it may hurt to touch them.  Pain when combing your hair or when laying your head down.  Pain in the jaw when chewing.  Pain in the throat or tongue.  Problems with your vision, such as sudden loss of vision in one eye, or seeing double.  Fever.  Fatigue.  A dry cough.  Pain in the hips and shoulders.  Pain in the arms during exercise.  Depression.  Weight loss. DIAGNOSIS  Your health care provider will ask about your symptoms and do a physical exam. He or she may also perform an eye exam and tests, such as:  A complete blood count.  An erythrocyte sedimentation rate test, also called the sed rate test.  A C-reactive protein (CRP) test.  A tissue  sample (biopsy) test. TREATMENT  Temporal arteritis is treated with a type of medicine called a corticosteroid. Vision problems may be treated with additional medicines. You will need to see your health care provider while you are being treated. During follow-up visits, your health care provider will check for problems by:  Performing blood tests and bone density tests.  Checking your blood pressure and blood sugar. HOME CARE INSTRUCTIONS   Take medicines only as directed by your health care provider.  Take any vitamins or  supplements that your health care provider suggests. These may include vitamin D and calcium, which help keep your bones from becoming weak.  Exercise. Talk with your health care provider about what exercises are okay for you to do. Usually exercises that increase your heart rate (aerobic exercise), such as walking, are recommended. Aerobic exercise helps control your blood pressure and prevent bone loss.  Follow a healthy diet. Include healthy sources of protein, fruits, vegetables, and whole grains in your diet. Following a healthy diet helps prevent bone damage and diabetes. SEEK MEDICAL CARE IF:   Your symptoms get worse.  Your fever, fatigue, headache, weight loss, or pain in your jaw gets worse.  You develop signs of infection, such as fever, swelling, redness, warmth, and tenderness. SEEK IMMEDIATE MEDICAL CARE IF:   Your vision gets worse.  Your pain does not go away, even after you take pain medicine.  You have chest pain.  You have trouble breathing.  One side of your face or body suddenly becomes weak or numb. MAKE SURE YOU:  Understand these instructions.  Will watch your condition.  Will get help right away if you are not doing well or get worse.   This information is not intended to replace advice given to you by your health care provider. Make sure you discuss any questions you have with your health care provider.   Document Released: 03/30/2009 Document Revised: 06/23/2014 Document Reviewed: 07/27/2013 Elsevier Interactive Patient Education 2016 [place].  Cluster Headache Cluster headaches are recognized by their pattern of deep, intense head pain. They normally occur on one side of your head, but they may "switch sides" in subsequent episodes. Typically, cluster headaches:   Are severe in nature.   Occur repeatedly over weeks to months and are followed by periods of no headaches.   Can last from 15 minutes to 3 hours.   Occur at the same time  each day, often at night.   Occur several times a day. CAUSES The exact cause of cluster headaches is not known. Alcohol use may be associated with cluster headaches. SIGNS AND SYMPTOMS   Severe pain that begins in or around your eye or temple.   One-sided head pain.   Feeling sick to your stomach (nauseous).   Sensitivity to light.   Runny nose.   Eye redness, tearing, and nasal stuffiness on the side of your head where you are experiencing pain.   Sweaty, pale skin of the face.   Droopy or swollen eyelid.   Restlessness. DIAGNOSIS  Cluster headaches are diagnosed based on symptoms and a physical exam. Your health care provider may order a CT scan or an MRI of your head or lab tests to see if your headaches are caused by other medical conditions.  TREATMENT   Medicines for pain relief and to prevent recurrent attacks. Some people may need a combination of medicines.  Oxygen for pain relief.   Biofeedback programs to help reduce headache pain.  It may  be helpful to keep a headache diary. This may help you find a trend for what is triggering your headaches. Your health care provider can develop a treatment plan.  HOME CARE INSTRUCTIONS  During cluster periods:   Follow a regular sleep schedule. Do not vary the amount and time that you sleep from day to day. It is important to stay on the same schedule during a cluster period to help prevent headaches.   Avoid alcohol.   Stop smoking if you smoke.  SEEK MEDICAL CARE IF:  You have any changes from your previous cluster headaches either in intensity or frequency.   You are not getting relief from medicines you are taking.  SEEK IMMEDIATE MEDICAL CARE IF:   You faint.   You have weakness or numbness, especially on one side of your body or face.   You have double vision.   You have nausea or vomiting that is not relieved within several hours.   You cannot keep your balance or have difficulty  talking or walking.   You have neck pain or stiffness.   You have a fever. MAKE SURE YOU:  Understand these instructions.   Will watch your condition.   Will get help right away if you are not doing well or get worse.   This information is not intended to replace advice given to you by your health care provider. Make sure you discuss any questions you have with your health care provider.   Document Released: 06/02/2005 Document Revised: 03/23/2013 Document Reviewed: 12/23/2012 Elsevier Interactive Patient Education 2016 [employer]  Occipital Neuralgia Occipital neuralgia is a type of headache that causes episodes of very bad pain in the back of your head. Pain from occipital neuralgia may spread (radiate) to other parts of your head. The pain is usually brief and often goes away after you rest and relax. These headaches may be caused by irritation of the nerves that leave your spinal cord high up in your neck, just below the base of your skull (occipital nerves). Your occipital nerves transmit sensations from the back of your head, the top of your head, and the areas behind your ears. CAUSES Occipital neuralgia can occur without any known cause (primary headache syndrome). In other cases, occipital neuralgia is caused by pressure on or irritation of one of the two occipital nerves. Causes of occipital nerve compression or irritation include:  Wear and tear of the vertebrae in the neck (osteoarthritis).  Neck injury.  Disease of the disks that separate the vertebrae.  Tumors.  Gout.  Infections.  Diabetes.  Swollen blood vessels that put pressure on the occipital nerves.  Muscle spasm in the neck. SIGNS AND SYMPTOMS Pain is the main symptom of occipital neuralgia. It usually starts in the back of the head but may also be felt in other areas supplied by the occipital nerves. Pain is usually on one side but may be on both sides. You may have:   Brief episodes of very  bad pain that is burning, stabbing, shocking, or shooting.  Pain behind the eye.  Pain triggered by neck movement or hair brushing.  Scalp tenderness.  Aching in the back of the head between episodes of very bad pain. DIAGNOSIS  Your health care provider may diagnose occipital neuralgia based on your symptoms and a physical exam. During the exam, the health care provider may push on areas supplied by the occipital nerves to see if they are painful. Some tests may also be done to  help in making the diagnosis. These may include:  Imaging studies of the upper spinal cord, such as an MRI or CT scan. These may show compression or spinal cord abnormalities.  Nerve block. You will get an injection of numbing medicine (local anesthetic) near the occipital nerve to see if this relieves pain. TREATMENT  Treatment may begin with simple measures, such as:   Rest.  Massage.  Heat.  Over-the-counter pain relievers. If these measures do not work, you may need other treatments, including:  Medicines such as:  Prescription-strength anti-inflammatory medicines.  Muscle relaxants.  Antiseizure medicines.  Antidepressants.  Steroid injection. This involves injections of local anesthetic and strong anti-inflammatory drugs (steroids).  Pulsed radiofrequency. Wires are implanted to deliver electrical impulses that block pain signals from the occipital nerve.  Physical therapy.  Surgery to relieve nerve pressure. HOME CARE INSTRUCTIONS  Take all medicines as directed by your health care provider.  Avoid activities that cause pain.  Rest when you have an attack of pain.  Try gentle massage or a heating pad to relieve pain.  Work with a physical therapist to learn stretching exercises you can do at home.  Try a different pillow or sleeping position.  Practice good posture.  Try to stay active. Get regular exercise that does not cause pain. Ask your health care provider to suggest  safe exercises for you.  Keep all follow-up visits as directed by your health care provider. This is important. SEEK MEDICAL CARE IF:  Your medicine is not working.  You have new or worsening symptoms. SEEK IMMEDIATE MEDICAL CARE IF:  You have very bad head pain that is not going away.  You have a sudden change in vision, balance, or speech. MAKE SURE YOU:  Understand these instructions.  Will watch your condition.  Will get help right away if you are not doing well or get worse.   This information is not intended to replace advice given to you by your health care provider. Make sure you discuss any questions you have with your health care provider.   Document Released: 05/27/2001 Document Revised: 06/23/2014 Document Reviewed: 05/25/2013 Elsevier Interactive Patient Education 2016 [employer] Inc[employer]  Tension Headache A tension headache is a feeling of pain, pressure, or aching that is often felt over the front and sides of the head. The pain can be dull, or it can feel tight (constricting). Tension headaches are not normally associated with nausea or vomiting, and they do not get worse with physical activity. Tension headaches can last from 30 minutes to several days. This is the most common type of headache. CAUSES The exact cause of this condition is not known. Tension headaches often begin after stress, anxiety, or depression. Other triggers may include:  Alcohol.  Too much caffeine, or caffeine withdrawal.  Respiratory infections, such as colds, flu, or sinus infections.  Dental problems or teeth clenching.  Fatigue.  Holding your head and neck in the same position for a long period of time, such as while using a computer.  Smoking. SYMPTOMS Symptoms of this condition include:  A feeling of pressure around the head.  Dull, aching head pain.  Pain felt over the front and sides of the head.  Tenderness in the muscles of the head, neck, and  shoulders. DIAGNOSIS This condition may be diagnosed based on your symptoms and a physical exam. Tests may be done, such as a CT scan or an MRI of your head. These tests may be done if your symptoms are  severe or unusual. TREATMENT This condition may be treated with lifestyle changes and medicines to help relieve symptoms. HOME CARE INSTRUCTIONS Managing Pain  Take over-the-counter and prescription medicines only as told by your health care provider.  Lie down in a dark, quiet room when you have a headache.  If directed, apply ice to the head and neck area:  Put ice in a plastic bag.  Place a towel between your skin and the bag.  Leave the ice on for 20 minutes, 2-3 times per day.  Use a heating pad or a hot shower to apply heat to the head and neck area as told by your health care provider. Eating and Drinking  Eat meals on a regular schedule.  Limit alcohol use.  Decrease your caffeine intake, or stop using caffeine. General Instructions  Keep all follow-up visits as told by your health care provider. This is important.  Keep a headache journal to help find out what may trigger your headaches. For example, write down:  What you eat and drink.  How much sleep you get.  Any change to your diet or medicines.  Try massage or other relaxation techniques.  Limit stress.  Sit up straight, and avoid tensing your muscles.  Do not use tobacco products, including cigarettes, chewing tobacco, or e-cigarettes. If you need help quitting, ask your health care provider.  Exercise regularly as told by your health care provider.  Get 7-9 hours of sleep, or the amount recommended by your health care provider. SEEK MEDICAL CARE IF:  Your symptoms are not helped by medicine.  You have a headache that is different from what you normally experience.  You have nausea or you vomit.  You have a fever. SEEK IMMEDIATE MEDICAL CARE IF:  Your headache becomes severe.  You have  repeated vomiting.  You have a stiff neck.  You have a loss of vision.  You have problems with speech.  You have pain in your eye or ear.  You have muscular weakness or loss of muscle control.  You lose your balance or you have trouble walking.  You feel faint or you pass out.  You have confusion.   This information is not intended to replace advice given to you by your health care provider. Make sure you discuss any questions you have with your health care provider.   Document Released: 06/02/2005 Document Revised: 02/21/2015 Document Reviewed: 09/25/2014 Elsevier Interactive Patient Education [employer].

## 2015-12-19 NOTE — Progress Notes (Addendum)
Subjective:  By signing my name below, I, Moises Blood, attest that this documentation has been prepared under the direction and in the presence of Delman Cheadle, MD. Electronically Signed: Moises Blood, [place]. 12/19/2015 , 10:20 AM .  Patient was seen in Room 11 .   Patient ID: Emma Combs, female    DOB: 1956/01/13, 60 y.o.   MRN: [id number] Chief Complaint  Patient presents with  . Headache    with dizziness x 3 weeks    HPI Emma Combs is a 60 y.o. female who has h/o rheumatoid arthritis presents to [facility] complaining of headache with dizziness ongoing for about 6 weeks. Last seen here 7 months ago with right sided BPPV without headache at that time. She did report having intermittent vertigo for 6 months prior, currently receives her primary care here but has only been in for acute concerns.   Patient states she's been having headaches ongoing for about 6 weeks now. She notes that the headaches occur daily and occasionally feel better but headaches continue to return. She informs her headaches are located over her left temple with left eye watering. She does have sensitivity to light. She's been drinking plenty of water with some exercise for relief. She's only taken tylenol once when it was unbearable. When she stands or changes positions quickly, she feels like the room is spinning. She denies rhinorrhea. She denies migraines running in her family.   She's been under more stress lately due to delayed colonoscopy. She requests needing a referral to GI for colonoscopy. It was delayed by 2 years because she was on a blood thinner.   She saw her ophthalmologist (at [place] works) prior to the headaches starting. She was written new glasses but didn't receive the new glasses. She mentions needing to squint even with her glasses on today.   She usually comes here for care.   Past Medical History  Diagnosis Date  . Glucose intolerance (impaired glucose tolerance)   . Obesity   .  Coronary atherosclerosis of native coronary artery     08/12/2013: PTCA and stenting of the proximal and mid LAD with overlapping 3.0 x 28 mm in the proximal to mid segment and the ostial 3.0 x 12 mm Promus Premier drug-eluting stent. 02/28/2014: Stent distal RCA 2.75x15 Xience Alpine DES. Mod disease in Ramus intermediate. EF 55%  . Borderline diabetes mellitus   . High cholesterol   . Anginal pain ([place])   . Uterine fibroid   . Arthritis     "joints ache; hips" (02/28/2014)   Prior to Admission medications   Medication Sig Start Date End Date Taking? Authorizing Provider  ALOE VERA PO Take 8 oz by mouth daily.   Yes Historical Provider, MD  aspirin 81 MG chewable tablet Chew 1 tablet (81 mg total) by mouth daily. 08/12/13  Yes Adrian Prows, MD  atorvastatin (LIPITOR) 80 MG tablet Take 1 tablet (80 mg total) by mouth daily at 6 PM. 08/12/13  Yes Adrian Prows, MD  Coenzyme Q10 (Q-10 CO-ENZYME PO) Take 1 packet by mouth daily.   Yes Historical Provider, MD  Iron-Vitamins (VITALIZE PO) Take 2 tablets by mouth daily.   Yes Historical Provider, MD  metoprolol succinate (TOPROL-XL) 50 MG 24 hr tablet Take 1 tablet (50 mg total) by mouth daily. 08/12/13  Yes Adrian Prows, MD  Multiple Vitamin (MULTIVITAMIN WITH MINERALS) TABS tablet Take 2 tablets by mouth daily.   Yes Historical Provider, MD  nitroGLYCERIN (NITROSTAT) 0.4 MG SL tablet Place  0.4 mg under the tongue every 5 (five) minutes as needed for chest pain.   Yes Historical Provider, MD  Omega-3 Fatty Acids (OMEGA 3 PO) Take 2 capsules by mouth daily with breakfast.   Yes Historical Provider, MD   Allergies  Allergen Reactions  . Bactrim Other (See Comments)    Light headed, passing out    Review of Systems  Constitutional: Negative for fever, chills, activity change and fatigue.  HENT: Negative for rhinorrhea.   Eyes: Positive for photophobia and discharge. Negative for pain.  Respiratory: Negative for cough.   Gastrointestinal: Negative for nausea,  vomiting and diarrhea.  Neurological: Positive for dizziness and headaches.       Objective:   Physical Exam  Constitutional: She is oriented to person, place, and time. She appears well-developed and well-nourished. No distress.  HENT:  Head: Normocephalic and atraumatic.  Right Ear: Tympanic membrane normal.  Left Ear: Tympanic membrane normal.  Mouth/Throat: Oropharynx is clear and moist.  Minimal tenderness over temporal artery on the left, positive tenderness to palpation from the occipital groove when she does induce headache symptoms, nares pale and boggy  Eyes: EOM are normal. Pupils are equal, round, and reactive to light.  Visual fields intact by confrontation bilaterally, fundoscopic exam was benign  Neck: Neck supple. No thyromegaly present.  Cardiovascular: Normal rate, regular rhythm and normal heart sounds.   No murmur heard. Pulmonary/Chest: Effort normal and breath sounds normal. No respiratory distress.  Musculoskeletal: Normal range of motion.  Lymphadenopathy:    She has no cervical adenopathy.  Neurological: She is alert and oriented to person, place, and time.  Skin: Skin is warm and dry.  Psychiatric: She has a normal mood and affect. Her behavior is normal.  Nursing note and vitals reviewed.   BP 120/70 mmHg  Pulse 81  Temp(Src) 97.9 F (36.6 C) (Oral)  Resp 18  Ht 5' 5.5" (1.664 m)  Wt 212 lb (96.163 kg)  BMI 34.73 kg/m2  SpO2 99%    Visual Acuity Screening   Right eye Left eye Both eyes  Without correction:     With correction: 20/25-1 20/20 20/15-1  Hearing Screening Comments: Whisper test was 10 ft in both ears     Results for orders placed or performed in visit on 12/19/15  C-reactive protein  Result Value Ref Range   CRP <0.5 <0.60 mg/dL  Comprehensive metabolic panel  Result Value Ref Range   Sodium 142 135 - 146 mmol/L   Potassium 4.6 3.5 - 5.3 mmol/L   Chloride 104 98 - 110 mmol/L   CO2 28 20 - 31 mmol/L   Glucose, Bld 118 (H)  65 - 99 mg/dL   BUN 11 7 - 25 mg/dL   Creat 0.78 0.50 - 1.05 mg/dL   Total Bilirubin 0.7 0.2 - 1.2 mg/dL   Alkaline Phosphatase 50 33 - 130 U/L   AST 21 10 - 35 U/L   ALT 16 6 - 29 U/L   Total Protein 7.3 6.1 - 8.1 g/dL   Albumin 4.5 3.6 - 5.1 g/dL   Calcium 10.0 8.6 - 10.4 mg/dL  TSH  Result Value Ref Range   TSH  mIU/L  ANA,IFA RA Diag Pnl w/rflx Tit/Patn  Result Value Ref Range   Rhuematoid fact SerPl-aCnc <10 <=14 IU/mL   Anit Nuclear Antibody(ANA)  NEGATIVE   Cyclic Citrullin Peptide Ab  Units  POCT SEDIMENTATION RATE  Result Value Ref Range   POCT SED RATE 12 0 - 22  mm/hr  POCT CBC  Result Value Ref Range   WBC 4.0 (A) 4.6 - 10.2 K/uL   Lymph, poc 2.1 0.6 - 3.4   POC LYMPH PERCENT 52.6 (A) 10 - 50 %L   MID (cbc) 0.2 0 - 0.9   POC MID % 6.2 0 - 12 %M   POC Granulocyte 1.6 (A) 2 - 6.9   Granulocyte percent 41.2 37 - 80 %G   RBC 4.58 4.04 - 5.48 M/uL   Hemoglobin 13.8 12.2 - 16.2 g/dL   HCT, POC 40.3 37.7 - 47.9 %   MCV 88.1 80 - 97 fL   MCH, POC 30.0 27 - 31.2 pg   MCHC 34.1 31.8 - 35.4 g/dL   RDW, POC 13.9 %   Platelet Count, POC 118 (A) 142 - 424 K/uL   MPV 7.6 0 - 99.8 fL  POCT glucose (manual entry)  Result Value Ref Range   POC Glucose 122 (A) 70 - 99 mg/dl  POCT glycosylated hemoglobin (Hb A1C)  Result Value Ref Range   Hemoglobin A1C 6.5     Assessment & Plan:   1. Left temporal headache - ruled at giant cell arteritis with nml sed rate. Could be cluster type HA due to daily occurrence with eye watering so refer to neurology and will also need optho eval due to sensation of left orbit pain/pulling. Try to break HA in office today with toradol and promethazine IM and try prn fioricet but if HA continues would have low threshold for proceeding with head CT.  2. Hemorrhoids, unspecified hemorrhoid type - did not have time to evaluate today- offered to resched pt for that today, pt requests repeat referral to Dr. Ferdinand Lango with [facility].as is overdue  to for her colonoscopy - referral placed, refilled anusol x 1 per pt request.  Needs in OV eval for any additional refills.  3. Arthralgia   4. Special screening for malignant neoplasms, colon - refer to GI Dr. Ferdinand Lango at [facility]  5. Prediabetes   6. Left eye pain   7. Elevated glucose - new diagnosis of DM today - will call pt to discuss - rec tlc changes, recheck in 3 mos    Orders Placed This Encounter  Procedures  . C-reactive protein  . Comprehensive metabolic panel  . TSH  . ANA,IFA RA Diag Pnl w/rflx Tit/Patn  . Ambulatory referral to Neurology    Referral Priority:  Routine    Referral Type:  Consultation    Referral Reason:  Specialty Services Required    Requested Specialty:  Neurology    Number of Visits Requested:  1  . Ambulatory referral to Gastroenterology    Referral Priority:  Routine    Referral Type:  Consultation    Referral Reason:  Specialty Services Required    Number of Visits Requested:  1  . Ambulatory referral to Ophthalmology    Referral Priority:  Routine    Referral Type:  Consultation    Referral Reason:  Specialty Services Required    Requested Specialty:  Ophthalmology    Number of Visits Requested:  1  . POCT SEDIMENTATION RATE  . POCT CBC  . POCT glucose (manual entry)  . POCT glycosylated hemoglobin (Hb A1C)    Meds ordered this encounter  Medications  . ketorolac (TORADOL) injection 60 mg    Sig:   . promethazine (PHENERGAN) injection 25 mg    Sig:   . hydrocortisone (ANUSOL-HC) 25 MG suppository  Sig: Place 1 suppository (25 mg total) rectally 2 (two) times daily as needed for hemorrhoids.    Dispense:  24 suppository    Refill:  0  . butalbital-acetaminophen-caffeine (FIORICET) 50-325-40 MG tablet    Sig: Take 1-2 tablets by mouth every 6 (six) hours as needed for headache.    Dispense:  20 tablet    Refill:  0   Over 40 min spent in face-to-face evaluation of and consultation with patient and coordination of care.  Over 50%  of this time was spent counseling this patient.  I personally performed the services described in this documentation, which was scribed in my presence. The recorded information has been reviewed and considered, and addended by me as needed.   Delman Cheadle, M.D.  [facility] [address] [place], [place] [address] [phone] phone [phone] fax  12/19/2015 1:57 PM

## 2015-12-21 LAB — ANA,IFA RA DIAG PNL W/RFLX TIT/PATN
ANA: NEGATIVE
Rhuematoid fact SerPl-aCnc: <10 IU/mL (ref <=14)

## 2015-12-24 ENCOUNTER — Ambulatory Visit (INDEPENDENT_AMBULATORY_CARE_PROVIDER_SITE_OTHER): Payer: BC Managed Care – PPO | Admitting: Family Medicine

## 2015-12-24 ENCOUNTER — Ambulatory Visit (INDEPENDENT_AMBULATORY_CARE_PROVIDER_SITE_OTHER): Payer: BC Managed Care – PPO

## 2015-12-24 VITALS — HR 84 | Temp 98.3°F | Resp 16 | Ht 65.5 in | Wt 211.0 lb

## 2015-12-24 DIAGNOSIS — D696 Thrombocytopenia, unspecified: Secondary | ICD-10-CM | POA: Diagnosis not present

## 2015-12-24 DIAGNOSIS — R109 Unspecified abdominal pain: Secondary | ICD-10-CM

## 2015-12-24 DIAGNOSIS — R091 Pleurisy: Secondary | ICD-10-CM

## 2015-12-24 DIAGNOSIS — R1012 Left upper quadrant pain: Secondary | ICD-10-CM

## 2015-12-24 LAB — POCT CBC
Granulocyte percent: 40.3 %G (ref 37–80)
HEMATOCRIT: 38.8 % (ref 37.7–47.9)
HEMOGLOBIN: 13.4 g/dL (ref 12.2–16.2)
LYMPH, POC: 2.6 (ref 0.6–3.4)
MCH, POC: 30.5 pg (ref 27–31.2)
MCHC: 34.5 g/dL (ref 31.8–35.4)
MCV: 88.4 fL (ref 80–97)
MID (CBC): 0.3 (ref 0–0.9)
MPV: 7.4 fL (ref 0–99.8)
PLATELET COUNT, POC: 122 10*3/uL — AB (ref 142–424)
POC Granulocyte: 2 (ref 2–6.9)
POC LYMPH PERCENT: 53.7 %L — AB (ref 10–50)
POC MID %: 6 % (ref 0–12)
RBC: 4.39 M/uL (ref 4.04–5.48)
RDW, POC: 13.7 %
WBC: 4.9 10*3/uL (ref 4.6–10.2)

## 2015-12-24 LAB — POCT URINALYSIS DIP (MANUAL ENTRY)
BILIRUBIN UA: NEGATIVE
BILIRUBIN UA: NEGATIVE
GLUCOSE UA: NEGATIVE
Leukocytes, UA: NEGATIVE
Nitrite, UA: NEGATIVE
Protein Ur, POC: NEGATIVE
RBC UA: NEGATIVE
SPEC GRAV UA: 1.015
Urobilinogen, UA: 0.2
pH, UA: 8.5

## 2015-12-24 LAB — D-DIMER, QUANTITATIVE: D-Dimer, Quant: 0.5 mcg/mL FEU — ABNORMAL HIGH (ref <0.50)

## 2015-12-24 MED ORDER — DICLOFENAC SODIUM 75 MG PO TBEC: 75.0000 mg | DELAYED_RELEASE_TABLET | Freq: Two times a day (BID) | ORAL | Status: DC

## 2015-12-24 NOTE — Patient Instructions (Addendum)
Take diclofenac one twice daily for chest wall pain and inflammation  In addition take tylenol (acetominophen) 500 mg 2 pills 3 times daily if needed for pain relief  I will let you know the result of the remaining blood test  Return or go to the ER at any time if abruptly worse  Take a mild laxative such as miralax if needed for bowels and gas buildup.  If a rash develops return promptly  Get your platelets checked once or twice a year      IF you received an x-ray today, you will receive an invoice from [facility]. Please contact [facility] Radiology at [phone] with questions or concerns regarding your invoice.   IF you received labwork today, you will receive an invoice from [employer]. Please contact [facility] at [phone] with questions or concerns regarding your invoice.   Our billing staff will not be able to assist you with questions regarding bills from these companies.  You will be contacted with the lab results as soon as they are available. The fastest way to get your results is to activate your My Chart account. Instructions are located on the last page of this paperwork. If you have not heard from [place] regarding the results in 2 weeks, please contact this office.

## 2015-12-24 NOTE — Progress Notes (Signed)
Patient ID: Emma Combs, female    DOB: 05-Oct-1955  Age: 60 y.o. MRN: [id number]  Chief Complaint  Patient presents with  . 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 She is from the [place]. It is been several years since I have seen her. She is here last week with headache and other items. She was noted to be mildly thrombocytopenic. That has been low for some time. She developed left flank pain. Knows of no mechanical injury. It is been hurting her when she breathes deep or moves in the left lateral lower rib cage. She's not had any calf pain. She does have a little edema in her ankles. She has not had pain like this before. She has not seen any rashes.  Current allergies, medications, problem list, past/family and social histories reviewed.  Objective:  Pulse 84  Temp(Src) 98.3 F (36.8 C) (Oral)  Resp 16  Ht 5' 5.5" (1.664 m)  Wt 211 lb (95.709 kg)  BMI 34.57 kg/m2  SpO2 98%  No major distress when seated still, but when she breathes deeper rotates her trunk she starts hurting quite a lot. Her heart is regular. Lungs clear. Abdomen soft without mass or tenderness. The chest wall seems a little tender at times on the mid lateral lower chest wall midaxillary line. It does not seem real reproducible, with pain seeming to migrate a little bit especially when she breathes deep.  Assessment & Plan:   Assessment: 1. Acute left flank pain   2. Pleurisy   3. Thrombocytopenia ([place])       Plan: I do not believe this is shingles. There is no rash coming on. It acts more like a pleural pain though it could be from the chest wall also. Does not sound urinary. Will check x-rays and labs.  Orders Placed This Encounter  Procedures  . DG Ribs Unilateral W/Chest Left    Order Specific Question:  Reason for Exam (SYMPTOM  OR DIAGNOSIS REQUIRED)    Answer:  left rib pain with pleuretic symptoms left    Order Specific Question:  Is  the patient pregnant?    Answer:  No    Order Specific Question:  Preferred imaging location?    Answer:  External  . D-dimer, quantitative (not at [facility])    Left pleurisy  . POCT CBC  . POCT Microscopic Urinalysis ([facility])  . POCT urinalysis dipstick    Meds ordered this encounter  Medications  . diclofenac (VOLTAREN) 75 MG EC tablet    Sig: Take 1 tablet (75 mg total) by mouth 2 (two) times daily.    Dispense:  30 tablet    Refill:  0    Results for orders placed or performed in visit on 12/24/15  POCT CBC  Result Value Ref Range   WBC 4.9 4.6 - 10.2 K/uL   Lymph, poc 2.6 0.6 - 3.4   POC LYMPH PERCENT 53.7 (A) 10 - 50 %L   MID (cbc) 0.3 0 - 0.9   POC MID % 6.0 0 - 12 %M   POC Granulocyte 2.0 2 - 6.9   Granulocyte percent 40.3 37 - 80 %G   RBC 4.39 4.04 - 5.48 M/uL   Hemoglobin 13.4 12.2 - 16.2 g/dL   HCT, POC 38.8 37.7 - 47.9 %   MCV 88.4 80 - 97 fL   MCH, POC 30.5 27 - 31.2 pg  MCHC 34.5 31.8 - 35.4 g/dL   RDW, POC 13.7 %   Platelet Count, POC 122 (A) 142 - 424 K/uL   MPV 7.4 0 - 99.8 fL  POCT Microscopic Urinalysis ([facility])  Result Value Ref Range   WBC,UR,HPF,POC None None WBC/hpf   RBC,UR,HPF,POC None None RBC/hpf   Bacteria None None, Too numerous to count   Mucus Absent Absent   Epithelial Cells, UR Per Microscopy None None, Too numerous to count cells/hpf  POCT urinalysis dipstick  Result Value Ref Range   Color, UA yellow yellow   Clarity, UA clear clear   Glucose, UA negative negative   Bilirubin, UA negative negative   Ketones, POC UA negative negative   Spec Grav, UA 1.015    Blood, UA negative negative   pH, UA 8.5    Protein Ur, POC negative negative   Urobilinogen, UA 0.2    Nitrite, UA Negative Negative   Leukocytes, UA Negative Negative        Patient Instructions   Take diclofenac one twice daily for chest wall pain and inflammation  In addition take tylenol (acetominophen) 500 mg 2 pills 3 times daily if needed for pain relief  I  will let you know the result of the remaining blood test  Return or go to the ER at any time if abruptly worse  Take a mild laxative such as miralax if needed for bowels and gas buildup.  If a rash develops return promptly  Get your platelets checked once or twice a year      IF you received an x-ray today, you will receive an invoice from [facility] Rad[facility]. Please contact [facility] Radiology at [phone] with questions or concerns regarding your invoice.   IF you received labwork today, you will receive an invoice from [employer]. Please contact [facility] at [phone] with questions or concerns regarding your invoice.   Our billing staff will not be able to assist you with questions regarding bills from these companies.  You will be contacted with the lab results as soon as they are available. The fastest way to get your results is to activate your My Chart account. Instructions are located on the last page of this paperwork. If you have not heard from [place] regarding the results in 2 weeks, please contact this office.          Return if symptoms worsen or fail to improve.   HOPPER,DAVID, MD 12/24/2015

## 2015-12-26 ENCOUNTER — Encounter: Payer: Self-pay | Admitting: Family Medicine

## 2015-12-26 ENCOUNTER — Telehealth: Payer: Self-pay | Admitting: Family Medicine

## 2015-12-26 ENCOUNTER — Ambulatory Visit (HOSPITAL_COMMUNITY)
Admission: RE | Admit: 2015-12-26 | Discharge: 2015-12-26 | Disposition: A | Discharge status: Home / Self Care | Payer: BC Managed Care – PPO | Source: Ambulatory Visit | Attending: Family Medicine | Admitting: Family Medicine

## 2015-12-26 ENCOUNTER — Telehealth: Payer: Self-pay | Admitting: *Deleted

## 2015-12-26 ENCOUNTER — Ambulatory Visit (INDEPENDENT_AMBULATORY_CARE_PROVIDER_SITE_OTHER): Payer: BC Managed Care – PPO | Admitting: Family Medicine

## 2015-12-26 ENCOUNTER — Encounter (HOSPITAL_COMMUNITY): Payer: Self-pay

## 2015-12-26 VITALS — BP 120/70 | HR 86 | Temp 98.3°F | Resp 16 | Ht 65.0 in | Wt 213.0 lb

## 2015-12-26 DIAGNOSIS — I517 Cardiomegaly: Secondary | ICD-10-CM | POA: Insufficient documentation

## 2015-12-26 DIAGNOSIS — Z955 Presence of coronary angioplasty implant and graft: Secondary | ICD-10-CM | POA: Insufficient documentation

## 2015-12-26 DIAGNOSIS — R091 Pleurisy: Secondary | ICD-10-CM | POA: Diagnosis present

## 2015-12-26 MED ORDER — CYCLOBENZAPRINE HCL 10 MG PO TABS: 10.0000 mg | ORAL_TABLET | Freq: Three times a day (TID) | ORAL | Status: DC | PRN

## 2015-12-26 MED ORDER — IOPAMIDOL (ISOVUE-370) INJECTION 76%
INTRAVENOUS | Status: AC
  Administered 2015-12-26: 100 mL
  Filled 2015-12-26: qty 100

## 2015-12-26 NOTE — Patient Instructions (Addendum)
  Great to meet you!  We will contact you with results as soon as they are available. If it is positive for a clot we will send you to the emergency room   IF you received an x-ray today, you will receive an invoice from [facility]. Please contact [facility] Radiology at [phone] with questions or concerns regarding your invoice.   IF you received labwork today, you will receive an invoice from [employer]. Please contact [facility] at [phone] with questions or concerns regarding your invoice.   Our billing staff will not be able to assist you with questions regarding bills from these companies.  You will be contacted with the lab results as soon as they are available. The fastest way to get your results is to activate your My Chart account. Instructions are located on the last page of this paperwork. If you have not heard from [place] regarding the results in 2 weeks, please contact this office.

## 2015-12-26 NOTE — Progress Notes (Signed)
   HPI  Patient presents today here with continued L sided chest/back pain  Pt explains she has had this severe pain now for several days. Describes severe sharp pain worse with deep inspiration and movement, Worse with laying down at night.   She has not had any leg swelling. She is taking Asa daily for CAD.   She denies fevers, chills, sweats, or difficyulty tolerating food and fluid. She has been eating and drinking less due to concern for gas but has not had any improvement.   She states the pain is making it difficult to sleep. She is very anxious about it.   No improvement with voltaren  PMH: Smoking status noted ROS: Per HPI  Objective: BP 120/70 mmHg  Pulse 86  Temp(Src) 98.3 F (36.8 C) (Oral)  Resp 16  Ht 5\' 5"  (1.651 m)  Wt 213 lb (96.616 kg)  BMI 35.44 kg/m2  SpO2 99% Gen: NAD, alert, cooperative with exam HEENT: NCAT, CV: RRR, good S1/S2, no murmur Resp: CTABL, no wheezes, non-labored Ext: No edema, warm Neuro: Alert and oriented, No gross deficits  Assessment and plan:  # Pleuritic chest pain Upper end of normal D dimer 2 days ago, Considering severe pain and characteristic description I will hopefully rule out PE completely with a CTA.  UA negative for blood rules out stone Other labs largely non contributory. Has had very good work up to this point Dicussed close follow up tomorrow if negative Continue current meds for now.    Orders Placed This Encounter  Procedures  . CT Angio Chest W/Cm &/Or Wo Cm    Standing Status: Future     Number of Occurrences:      Standing Expiration Date: 03/27/2017    Order Specific Question:  If indicated for the ordered procedure, I authorize the administration of contrast media per Radiology protocol    Answer:  Yes    Order Specific Question:  Reason for Exam (SYMPTOM  OR DIAGNOSIS REQUIRED)    Answer:  severe pleuritic pain, eval for PE    Order Specific Question:  Is the patient pregnant?    Answer:  No   Order Specific Question:  Preferred imaging location?    Answer:  [facility]    Order Specific Question:  Call Results- Best Contact Number?    Answer:  [phone]    No orders of the defined types were placed in this encounter.    Kenn File, MD 11:49 AM

## 2015-12-26 NOTE — Telephone Encounter (Signed)
Called and updated pt on CT results.   Negative forPE and other acute problems.   Continue NSAIDs and add flexeril, low threshold for follow up.   Laroy Apple, MD 12/26/2015, 5:00 PM

## 2015-12-26 NOTE — Telephone Encounter (Signed)
Chest CT negative for pt.  Per Dr Wendi Snipes to tell CT tech to notify pt that CT is neg and to follow up tomorrow.  CT tech notified.

## 2016-05-20 ENCOUNTER — Ambulatory Visit (INDEPENDENT_AMBULATORY_CARE_PROVIDER_SITE_OTHER): Payer: BC Managed Care – PPO

## 2016-05-20 ENCOUNTER — Ambulatory Visit (INDEPENDENT_AMBULATORY_CARE_PROVIDER_SITE_OTHER): Payer: BC Managed Care – PPO | Admitting: Physician Assistant

## 2016-05-20 VITALS — BP 130/80 | HR 85 | Temp 98.7°F | Resp 17 | Ht 65.5 in | Wt 218.0 lb

## 2016-05-20 DIAGNOSIS — R0602 Shortness of breath: Secondary | ICD-10-CM

## 2016-05-20 DIAGNOSIS — R51 Headache: Secondary | ICD-10-CM | POA: Diagnosis not present

## 2016-05-20 DIAGNOSIS — R519 Headache, unspecified: Secondary | ICD-10-CM

## 2016-05-20 DIAGNOSIS — J069 Acute upper respiratory infection, unspecified: Secondary | ICD-10-CM

## 2016-05-20 LAB — POC MICROSCOPIC URINALYSIS (UMFC): MUCUS RE: ABSENT

## 2016-05-20 LAB — POCT CBC
Granulocyte percent: 45.5 %G (ref 37–80)
HEMATOCRIT: 39.8 % (ref 37.7–47.9)
HEMOGLOBIN: 13.6 g/dL (ref 12.2–16.2)
Lymph, poc: 2.1 (ref 0.6–3.4)
MCH: 30.2 pg (ref 27–31.2)
MCHC: 34.2 g/dL (ref 31.8–35.4)
MCV: 88.4 fL (ref 80–97)
MID (CBC): 0.3 (ref 0–0.9)
MPV: 7.2 fL (ref 0–99.8)
POC GRANULOCYTE: 2 (ref 2–6.9)
POC LYMPH PERCENT: 48.3 %L (ref 10–50)
POC MID %: 6.2 % (ref 0–12)
Platelet Count, POC: 149 10*3/uL (ref 142–424)
RBC: 4.5 M/uL (ref 4.04–5.48)
RDW, POC: 13.6 %
WBC: 4.4 10*3/uL — AB (ref 4.6–10.2)

## 2016-05-20 LAB — POCT URINALYSIS DIP (MANUAL ENTRY)
BILIRUBIN UA: NEGATIVE
Bilirubin, UA: NEGATIVE
Blood, UA: NEGATIVE
Glucose, UA: NEGATIVE
LEUKOCYTES UA: NEGATIVE
NITRITE UA: NEGATIVE
PH UA: 6.5
PROTEIN UA: NEGATIVE
Spec Grav, UA: 1.015
UROBILINOGEN UA: 0.2

## 2016-05-20 NOTE — Patient Instructions (Addendum)
Follow up with Dr. Nadyne Coombes. In the meantime, use tylenol for your headaches. Hold off on intense exercise while waiting to see him. Make sure you drink at least 72 oz of water daily and see if this helps.   If symptoms worsen, follow up sooner.  IF you received an x-ray today, you will receive an invoice from [facility] Radiology. Please contact [facility] Radiology at [phone] with questions or concerns regarding your invoice.   IF you received labwork today, you will receive an invoice from [employer]. Please contact [facility] at [phone] with questions or concerns regarding your invoice.   Our billing staff will not be able to assist you with questions regarding bills from these companies.  You will be contacted with the lab results as soon as they are available. The fastest way to get your results is to activate your My Chart account. Instructions are located on the last page of this paperwork. If you have not heard from [place] regarding the results in 2 weeks, please contact this office.

## 2016-05-20 NOTE — Progress Notes (Signed)
Emma Combs  MRN: [id number] DOB: 1955-07-12  Subjective:  Emma Combs is a 60 y.o. female seen in office today for a chief complaint of SOB x 1 week. Has associated subjective fever, fatigue, dizziness, and headache.    Headache: Started 2.5 weeks ago after she started exercising. Worsened with activity, light, and noise. Denies nausea, vomiting, visual disturbance, weakness, or speech difficulty. Of note, pt does drink 64 oz of water daily. She has a history of headaches. Her last headaches were exacerbated by her tight eye glasses, but since that has been fixed she has not any issues. No family history of migraines. The only thing she has changed lately is exercise participation. started participating in exercise 3 weeks ago. She notes she was good until she did a bent exercise and she then got a headache. Since then, every time she bends down or gets excited sexually the pain in her head is very intense throbbing pain, feels as if it is going to explode. She will have the headache for about an hour after that.   SOB: For the past week she has noticed SOB when she bends down to tie her shoes. Has associated bilateral lower leg swelling (baseline for her), dizziness (one episode), and intermittent hot flashes, especially when she is exercising.  Denies chest pain, heart palpitations, nausea, and vomiting. She denies recent travel, history of DVT, history of malignancy, and recent immobilization. She is not currently short of breath right now.  Of note, she has had a head cold for the past week. She feels as if her head is heavy. She has associated nasal congestion, sore throat, and intermittent dry cough. Denies sinsus pressure, ear fullness, and body aches. Last night she tried 2 tylenol for the first time.     She had stent placement in LAD in 2015. She contacted Dr. Einar Gip and he recommened she come here because it sound respiratory.   Review of Systems Per HPI   Patient Active Problem  List   Diagnosis Date Noted  . Coronary atherosclerosis of native coronary artery 08/12/2013  . S/P PTCA (percutaneous transluminal coronary angioplasty) 08/11/2013  . Obesity 08/24/2011  . Glucose intolerance (impaired glucose tolerance) 08/24/2011    Current Outpatient Prescriptions on File Prior to Visit  Medication Sig Dispense Refill  . ALOE VERA PO Take 8 oz by mouth daily.    Marland Kitchen aspirin 81 MG chewable tablet Chew 1 tablet (81 mg total) by mouth daily. (Patient taking differently: Chew 162 mg by mouth daily. )    . atorvastatin (LIPITOR) 80 MG tablet Take 1 tablet (80 mg total) by mouth daily at 6 PM. (Patient taking differently: Take 40 mg by mouth daily. ) 30 tablet 3  . Coenzyme Q10 (Q-10 CO-ENZYME PO) Take 1 packet by mouth daily.    . hydrocortisone (ANUSOL-HC) 25 MG suppository Place 1 suppository (25 mg total) rectally 2 (two) times daily as needed for hemorrhoids. 24 suppository 0  . metoprolol succinate (TOPROL-XL) 50 MG 24 hr tablet Take 1 tablet (50 mg total) by mouth daily. 30 tablet 1  . Multiple Vitamin (MULTIVITAMIN WITH MINERALS) TABS tablet Take 2 tablets by mouth daily.    . nitroGLYCERIN (NITROSTAT) 0.4 MG SL tablet Place 0.4 mg under the tongue every 5 (five) minutes as needed for chest pain.    . Omega-3 Fatty Acids (OMEGA 3 PO) Take 2 capsules by mouth daily with breakfast.     No current facility-administered medications on file prior to visit.  Allergies  Allergen Reactions  . Bactrim Other (See Comments)    Light headed, passing out      Social History   Social History  . Marital status: Married    Spouse name: N/A  . Number of children: 5  . Years of education: N/A   Occupational History  .  [employer]   Social History Main Topics  . Smoking status: Never Smoker  . Smokeless tobacco: Never Used  . Alcohol use 4.2 oz/week    7 Glasses of wine per week  . Drug use: No  . Sexual activity: Yes   Other Topics Concern  . Not  on file   Social History Narrative   Marital status: married      Children: five children.       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      Exercise: none   Past Medical History:  Diagnosis Date  . Anginal pain ([place])   . Arthritis    "joints ache; hips" (02/28/2014)  . Borderline diabetes mellitus   . Coronary atherosclerosis of native coronary artery    08/12/2013: PTCA and stenting of the proximal and mid LAD with overlapping 3.0 x 28 mm in the proximal to mid segment and the ostial 3.0 x 12 mm Promus Premier drug-eluting stent. 02/28/2014: Stent distal RCA 2.75x15 Xience Alpine DES. Mod disease in Ramus intermediate. EF 55%  . Glucose intolerance (impaired glucose tolerance)   . High cholesterol   . Obesity   . Uterine fibroid    Past Surgical History:  Procedure Laterality Date  . CORONARY ANGIOPLASTY WITH STENT PLACEMENT  08/11/2013; 02/28/2014   "2 + 1"  . CORONARY STENT PLACEMENT  08/11/2013  . HEMORRHOID BANDING  2009  . LEFT AND RIGHT HEART CATHETERIZATION WITH CORONARY ANGIOGRAM N/A 08/11/2013   Procedure: LEFT AND RIGHT HEART CATHETERIZATION WITH CORONARY ANGIOGRAM;  Surgeon: Laverda Page, MD;  Location: [facility] CATH LAB;  Service: Cardiovascular;  Laterality: N/A;  . LEFT HEART CATHETERIZATION WITH CORONARY ANGIOGRAM N/A 02/28/2014   Procedure: LEFT HEART CATHETERIZATION WITH CORONARY ANGIOGRAM;  Surgeon: Laverda Page, MD;  Location: [facility] CATH LAB;  Service: Cardiovascular;  Laterality: N/A;  . PERCUTANEOUS CORONARY STENT INTERVENTION (PCI-S)  02/28/2014   Procedure: PERCUTANEOUS CORONARY STENT INTERVENTION (PCI-S);  Surgeon: Laverda Page, MD;  Location: [facility] CATH LAB;  Service: Cardiovascular;;  2.75 x 61m Xience DEstent distal RCA  . TEE WITHOUT CARDIOVERSION N/A 09/06/2013   Procedure: TRANSESOPHAGEAL ECHOCARDIOGRAM (TEE);  Surgeon: JLaverda Page MD;  [place]  Service: Cardiovascular;   Laterality: N/A;  . TUBAL LIGATION  1992    Objective:  BP 130/80 (BP Location: Right Arm, Patient Position: Sitting, Cuff Size: Normal)   Pulse 85   Temp 98.7 F (37.1 C) (Oral)   Resp 17   Ht 5' 5.5" (1.664 m)   Wt 218 lb (98.9 kg)   SpO2 99%   BMI 35.73 kg/m   Physical Exam  Constitutional: She is oriented to person, place, and time. She appears distressed (mild).  HENT:  Head: Normocephalic and atraumatic.  Right Ear: Tympanic membrane, external ear and ear canal normal.  Left Ear: Tympanic membrane, external ear and ear canal normal.  Nose: Mucosal edema present. Right sinus exhibits no maxillary sinus tenderness and no frontal sinus tenderness. Left sinus exhibits no maxillary sinus tenderness and no  frontal sinus tenderness.  Mouth/Throat: Uvula is midline and mucous membranes are normal. Posterior oropharyngeal erythema present.  Eyes: Conjunctivae and EOM are normal.  Neck: Normal range of motion.  Cardiovascular: Normal rate, regular rhythm, normal heart sounds and intact distal pulses.   Pulmonary/Chest: Effort normal and breath sounds normal.  Abdominal: Normal appearance.  Musculoskeletal:       Right lower leg: She exhibits edema (1+). She exhibits no tenderness.       Left lower leg: She exhibits edema (1+). She exhibits no tenderness.  Lymphadenopathy:       Head (right side): No submental, no submandibular, no tonsillar, no preauricular, no posterior auricular and no occipital adenopathy present.       Head (left side): No submental, no submandibular, no tonsillar, no preauricular, no posterior auricular and no occipital adenopathy present.    She has no cervical adenopathy.       Right: No supraclavicular adenopathy present.       Left: No supraclavicular adenopathy present.  Neurological: She is alert and oriented to person, place, and time. She has normal sensation, normal strength, normal reflexes and intact cranial nerves. Gait normal.  Skin: Skin is warm  and dry.  Psychiatric: Affect normal.  Vitals reviewed.    Wt Readings from Last 3 Encounters:  05/20/16 218 lb (98.9 kg)  12/26/15 213 lb (96.6 kg)  12/24/15 211 lb (95.7 kg)     Results for orders placed or performed in visit on 05/20/16 (from the past 24 hour(s))  POCT urinalysis dipstick     Status: None   Collection Time: 05/20/16  8:58 AM  Result Value Ref Range   Color, UA yellow yellow   Clarity, UA clear clear   Glucose, UA negative negative   Bilirubin, UA negative negative   Ketones, POC UA negative negative   Spec Grav, UA 1.015    Blood, UA negative negative   pH, UA 6.5    Protein Ur, POC negative negative   Urobilinogen, UA 0.2    Nitrite, UA Negative Negative   Leukocytes, UA Negative Negative  POCT CBC     Status: Abnormal   Collection Time: 05/20/16  8:59 AM  Result Value Ref Range   WBC 4.4 (A) 4.6 - 10.2 K/uL   Lymph, poc 2.1 0.6 - 3.4   POC LYMPH PERCENT 48.3 10 - 50 %L   MID (cbc) 0.3 0 - 0.9   POC MID % 6.2 0 - 12 %M   POC Granulocyte 2.0 2 - 6.9   Granulocyte percent 45.5 37 - 80 %G   RBC 4.50 4.04 - 5.48 M/uL   Hemoglobin 13.6 12.2 - 16.2 g/dL   HCT, POC 39.8 37.7 - 47.9 %   MCV 88.4 80 - 97 fL   MCH, POC 30.2 27 - 31.2 pg   MCHC 34.2 31.8 - 35.4 g/dL   RDW, POC 13.6 %   Platelet Count, POC 149 142 - 424 K/uL   MPV 7.2 0 - 99.8 fL  POCT Microscopic Urinalysis ([facility])     Status: Abnormal   Collection Time: 05/20/16  9:15 AM  Result Value Ref Range   WBC,UR,HPF,POC None None WBC/hpf   RBC,UR,HPF,POC None None RBC/hpf   Bacteria None None, Too numerous to count   Mucus Absent Absent   Epithelial Cells, UR Per Microscopy Few (A) None, Too numerous to count cells/hpf   Dg Chest 2 View  Result Date: 05/20/2016 CLINICAL DATA:  One week of shortness of  breath. Coronary stent placement in 2015. Nonsmoker EXAM: CHEST  2 VIEW COMPARISON:  Chest x-ray of December 24, 2015 FINDINGS: The lungs are adequately inflated and clear. The heart is top-normal  in size but stable. The pulmonary vascularity is normal. The mediastinum is normal in width. There is calcification in the wall of the aortic arch. There is no pleural effusion. The bony thorax is unremarkable. IMPRESSION: There is no active cardiopulmonary disease. Thoracic aortic atherosclerosis. Electronically Signed   By: David  Martinique M.D.   On: 05/20/2016 08:44   EKG normal sinus rhythm at 72 bpm. Q wave in lead III which is new in comparison to prior EKG in 02/28/2014. EKG presented to Dr. Nolon Rod.   Assessment and Plan :  This case was precepted with Dr. Nolon Rod  1. SOB (shortness of breath) POCT Labs do not suggest infectious etiology responsible for SOB, CXR clear. Pt given referral to cardiology given EKG hanges. She will likely need a stress test since both the headache and SOB are exercise induced with a cardiac history. - POCT CBC - POCT urinalysis dipstick - POCT Microscopic Urinalysis ([facility]) - TSH - CMP14+EGFR - DG Chest 2 View; Future - EKG 12-Lead - Orthostatic vital signs - Ambulatory referral to Cardiology  2. Nonintractable headache, unspecified chronicity pattern, unspecified headache type -Pending cardiology consult, will refer to headache clinic.  - AMB referral to headache clinic  3. Acute upper respiratory infection -Likely viral etiology, recommend OTC mucinex and flonase.  -Return to clinic if symptoms worsen, do not improve, or as needed  Tenna Delaine PA-C  [facility] 05/20/2016 9:41 AM

## 2016-05-21 LAB — CMP14+EGFR
A/G RATIO: 1.5 (ref 1.2–2.2)
ALK PHOS: 69 IU/L (ref 39–117)
ALT: 20 IU/L (ref 0–32)
AST: 23 IU/L (ref 0–40)
Albumin: 4.4 g/dL (ref 3.5–5.5)
BILIRUBIN TOTAL: 0.4 mg/dL (ref 0.0–1.2)
BUN/Creatinine Ratio: 12 (ref 9–23)
BUN: 10 mg/dL (ref 6–24)
CHLORIDE: 102 mmol/L (ref 96–106)
CO2: 26 mmol/L (ref 18–29)
Calcium: 9.5 mg/dL (ref 8.7–10.2)
Creatinine, Ser: 0.85 mg/dL (ref 0.57–1.00)
GFR calc non Af Amer: 75 mL/min/{1.73_m2} (ref >59)
GFR, EST AFRICAN AMERICAN: 87 mL/min/{1.73_m2} (ref >59)
GLUCOSE: 108 mg/dL — AB (ref 65–99)
Globulin, Total: 2.9 g/dL (ref 1.5–4.5)
POTASSIUM: 4.5 mmol/L (ref 3.5–5.2)
Sodium: 142 mmol/L (ref 134–144)
TOTAL PROTEIN: 7.3 g/dL (ref 6.0–8.5)

## 2016-05-21 LAB — TSH: TSH: 2.16 u[IU]/mL (ref 0.450–4.500)

## 2016-06-05 ENCOUNTER — Encounter: Payer: Self-pay | Admitting: Family Medicine

## 2016-07-28 DIAGNOSIS — I2510 Atherosclerotic heart disease of native coronary artery without angina pectoris: Secondary | ICD-10-CM

## 2016-08-06 DIAGNOSIS — I2510 Atherosclerotic heart disease of native coronary artery without angina pectoris: Secondary | ICD-10-CM

## 2016-10-01 ENCOUNTER — Ambulatory Visit (INDEPENDENT_AMBULATORY_CARE_PROVIDER_SITE_OTHER): Payer: BC Managed Care – PPO | Admitting: Physician Assistant

## 2016-10-01 ENCOUNTER — Encounter: Payer: Self-pay | Admitting: Physician Assistant

## 2016-10-01 VITALS — BP 130/82 | HR 75 | Temp 98.2°F | Resp 16 | Ht 65.0 in | Wt 209.0 lb

## 2016-10-01 DIAGNOSIS — E7439 Other disorders of intestinal carbohydrate absorption: Secondary | ICD-10-CM

## 2016-10-01 DIAGNOSIS — K0889 Other specified disorders of teeth and supporting structures: Secondary | ICD-10-CM | POA: Diagnosis not present

## 2016-10-01 MED ORDER — HYDROCODONE-ACETAMINOPHEN 5-325 MG PO TABS: 1.0000 | ORAL_TABLET | Freq: Four times a day (QID) | ORAL | 0 refills | Status: DC | PRN

## 2016-10-01 NOTE — Patient Instructions (Addendum)
     IF you received an x-ray today, you will receive an invoice from [facility]. Please contact [facility] at [phone] with questions or concerns regarding your invoice.   IF you received labwork today, you will receive an invoice from [facility]. Please contact [facility] at [phone] with questions or concerns regarding your invoice.   Our billing staff will not be able to assist you with questions regarding bills from these companies.  You will be contacted with the lab results as soon as they are available. The fastest way to get your results is to activate your My Chart account. Instructions are located on the last page of this paperwork. If you have not heard from us regarding the results in 2 weeks, please contact this office.    We recommend that you schedule a mammogram for breast cancer screening. Typically, you do not need a referral to do this. Please contact a local imaging center to schedule your mammogram.  [facility] - [phone]  *ask for the Radiology Department [facility]) - [phone] or [phone]  [facility] - [phone] [facility] - [phone] [facility] - [phone]  *ask for the Radiology Department [facility] - [phone]  *ask for the Radiology Department [facility] - [phone]  *ask for the Mammography Department [facility] - [phone] 

## 2016-10-01 NOTE — Progress Notes (Signed)
PRIMARY CARE AT [place], [place] [address] [phone]  Date:  10/01/2016   Name:  Emma Combs   DOB:  Nov 01, 1955   MRN:  [id number]  PCP:  No PCP Per Patient    History of Present Illness:  Emma Combs is a 61 y.o. female patient who presents to PCP with  Chief Complaint  Patient presents with  . TOOTH ISSUES    had wisdom teeth removed on left lower side; having pain and don't feel well     patietn was seen for wisdom teeth removal 09/08/2016 on left side, 3 weeks ago.  She notes that she has had increased pain and swelling.  Pain radiates to her left ear. She states that she feels feverish, though she has not checked her temperature. This is followed by her dentist.  She was seen this morning and had a scraping.   She can chew or drink on that site.   She has not taken tylenol or ibuprofen at this time.    She was given augmentin at the dentist, and takes compliantly. Given norco for pain, and prednisolone which she finished 4 days ago.   Hx of borderline diabetes. No excessive thirst, polyuria, or nausea.  Patient Active Problem List   Diagnosis Date Noted  . Coronary atherosclerosis of native coronary artery 08/12/2013  . S/P PTCA (percutaneous transluminal coronary angioplasty) 08/11/2013  . Obesity 08/24/2011  . Glucose intolerance (impaired glucose tolerance) 08/24/2011    Past Medical History:  Diagnosis Date  . Anginal pain ([place])   . Arthritis    "joints ache; hips" (02/28/2014)  . Borderline diabetes mellitus   . Coronary atherosclerosis of native coronary artery    08/12/2013: PTCA and stenting of the proximal and mid LAD with overlapping 3.0 x 28 mm in the proximal to mid segment and the ostial 3.0 x 12 mm Promus Premier drug-eluting stent. 02/28/2014: Stent distal RCA 2.75x15 Xience Alpine DES. Mod disease in Ramus intermediate. EF 55%  . Glucose intolerance (impaired glucose tolerance)   . High cholesterol   . Obesity   . Uterine  fibroid     Past Surgical History:  Procedure Laterality Date  . CORONARY ANGIOPLASTY WITH STENT PLACEMENT  08/11/2013; 02/28/2014   "2 + 1"  . CORONARY STENT PLACEMENT  08/11/2013  . HEMORRHOID BANDING  2009  . LEFT AND RIGHT HEART CATHETERIZATION WITH CORONARY ANGIOGRAM N/A 08/11/2013   Procedure: LEFT AND RIGHT HEART CATHETERIZATION WITH CORONARY ANGIOGRAM;  Surgeon: Laverda Page, MD;  Location: [facility] CATH LAB;  Service: Cardiovascular;  Laterality: N/A;  . LEFT HEART CATHETERIZATION WITH CORONARY ANGIOGRAM N/A 02/28/2014   Procedure: LEFT HEART CATHETERIZATION WITH CORONARY ANGIOGRAM;  Surgeon: Laverda Page, MD;  Location: [facility] CATH LAB;  Service: Cardiovascular;  Laterality: N/A;  . PERCUTANEOUS CORONARY STENT INTERVENTION (PCI-S)  02/28/2014   Procedure: PERCUTANEOUS CORONARY STENT INTERVENTION (PCI-S);  Surgeon: Laverda Page, MD;  Location: [facility] CATH LAB;  Service: Cardiovascular;;  2.75 x 48mm Xience DEstent distal RCA  . TEE WITHOUT CARDIOVERSION N/A 09/06/2013   Procedure: TRANSESOPHAGEAL ECHOCARDIOGRAM (TEE);  Surgeon: Laverda Page, MD;  Location: [place];  Service: Cardiovascular;  Laterality: N/A;  . TUBAL LIGATION  1992    Social History  Substance Use Topics  . Smoking status: Never Smoker  . Smokeless tobacco: Never Used  . Alcohol use 4.2 oz/week    7 Glasses of wine per week    Family History  Problem Relation Age of Onset  .  Hyperlipidemia Mother   . Cancer Mother     leukemia  . Diabetes Mother   . Diabetes Father   . Hyperlipidemia Father   . Stroke Father   . Cancer Sister   . Diabetes Sister   . Hyperlipidemia Sister   . Hypertension Sister   . Hypertension Brother   . Hyperlipidemia Brother   . Diabetes Brother     Allergies  Allergen Reactions  . Bactrim Other (See Comments)    Light headed, passing out    Medication list has been reviewed and updated.  Current Outpatient Prescriptions on File Prior to Visit  Medication Sig  Dispense Refill  . ALOE VERA PO Take 8 oz by mouth daily.    Marland Kitchen aspirin 81 MG chewable tablet Chew 1 tablet (81 mg total) by mouth daily. (Patient taking differently: Chew 162 mg by mouth daily. )    . atorvastatin (LIPITOR) 80 MG tablet Take 1 tablet (80 mg total) by mouth daily at 6 PM. (Patient taking differently: Take 40 mg by mouth daily. ) 30 tablet 3  . Coenzyme Q10 (Q-10 CO-ENZYME PO) Take 1 packet by mouth daily.    . hydrocortisone (ANUSOL-HC) 25 MG suppository Place 1 suppository (25 mg total) rectally 2 (two) times daily as needed for hemorrhoids. 24 suppository 0  . metoprolol succinate (TOPROL-XL) 50 MG 24 hr tablet Take 1 tablet (50 mg total) by mouth daily. 30 tablet 1  . Multiple Vitamin (MULTIVITAMIN WITH MINERALS) TABS tablet Take 2 tablets by mouth daily.    . nitroGLYCERIN (NITROSTAT) 0.4 MG SL tablet Place 0.4 mg under the tongue every 5 (five) minutes as needed for chest pain.    . Omega-3 Fatty Acids (OMEGA 3 PO) Take 2 capsules by mouth daily with breakfast.     No current facility-administered medications on file prior to visit.     ROS ROS otherwise unremarkable unless listed above.  Physical Examination: BP 130/82   Pulse 75   Temp 98.2 F (36.8 C) (Oral)   Resp 16   Ht 5\' 5"  (1.651 m)   Wt 209 lb (94.8 kg)   SpO2 98%   BMI 34.78 kg/m  Ideal Body Weight: Weight in (lb) to have BMI = 25: 149.9  Physical Exam  Constitutional: She is oriented to person, place, and time. She appears well-developed and well-nourished. No distress.  HENT:  Head: Normocephalic and atraumatic.  Right Ear: Tympanic membrane, external ear and ear canal normal.  Left Ear: Tympanic membrane, external ear and ear canal normal.  Bloody gauze in mouth, without drainage.  Swelling along the left jaw.  No tenderness (lidocaine procedure this morning).    Eyes: Conjunctivae and EOM are normal. Pupils are equal, round, and reactive to light.  Cardiovascular: Normal rate.    Pulmonary/Chest: Effort normal. No respiratory distress.  Neurological: She is alert and oriented to person, place, and time.  Skin: She is not diaphoretic.  Psychiatric: She has a normal mood and affect. Her behavior is normal.   Assessment and Plan: Serinity Ware is a 61 y.o. female who is here today for cc of mouth pain. Refilling her norco today.  Advised to continue augmentin. I will recheck her borderline diabetes. She will check her temperature. Pain in a tooth or teeth - Plan: HYDROcodone-acetaminophen (NORCO) 5-325 MG tablet  Glucose intolerance - Plan: Hemoglobin A1c  Ivar Drape, PA-C [facility] 4/19/20187:27 AM

## 2016-10-02 LAB — HEMOGLOBIN A1C
ESTIMATED AVERAGE GLUCOSE: 131 mg/dL
Hgb A1c MFr Bld: 6.2 % — ABNORMAL HIGH (ref 4.8–5.6)

## 2017-01-10 ENCOUNTER — Ambulatory Visit (INDEPENDENT_AMBULATORY_CARE_PROVIDER_SITE_OTHER): Payer: BC Managed Care – PPO | Admitting: Osteopathic Medicine

## 2017-01-10 ENCOUNTER — Other Ambulatory Visit: Payer: Self-pay | Admitting: Osteopathic Medicine

## 2017-01-10 ENCOUNTER — Emergency Department (HOSPITAL_BASED_OUTPATIENT_CLINIC_OR_DEPARTMENT_OTHER): Payer: BC Managed Care – PPO

## 2017-01-10 ENCOUNTER — Encounter (HOSPITAL_BASED_OUTPATIENT_CLINIC_OR_DEPARTMENT_OTHER): Payer: Self-pay | Admitting: *Deleted

## 2017-01-10 ENCOUNTER — Emergency Department (HOSPITAL_BASED_OUTPATIENT_CLINIC_OR_DEPARTMENT_OTHER)
Admission: EM | Admit: 2017-01-10 | Discharge: 2017-01-10 | Disposition: A | Discharge status: Home / Self Care | Payer: BC Managed Care – PPO | Attending: Emergency Medicine | Admitting: Emergency Medicine

## 2017-01-10 ENCOUNTER — Encounter: Payer: Self-pay | Admitting: Osteopathic Medicine

## 2017-01-10 ENCOUNTER — Other Ambulatory Visit
Admission: RE | Admit: 2017-01-10 | Discharge: 2017-01-10 | Disposition: A | Discharge status: Home / Self Care | Payer: BC Managed Care – PPO | Source: Ambulatory Visit | Attending: *Deleted | Admitting: *Deleted

## 2017-01-10 VITALS — BP 127/76 | HR 77 | Temp 98.3°F | Resp 17 | Ht 64.5 in | Wt 211.5 lb

## 2017-01-10 DIAGNOSIS — G4452 New daily persistent headache (NDPH): Secondary | ICD-10-CM | POA: Diagnosis not present

## 2017-01-10 DIAGNOSIS — Z79899 Other long term (current) drug therapy: Secondary | ICD-10-CM | POA: Insufficient documentation

## 2017-01-10 DIAGNOSIS — H53142 Visual discomfort, left eye: Secondary | ICD-10-CM

## 2017-01-10 DIAGNOSIS — I25119 Atherosclerotic heart disease of native coronary artery with unspecified angina pectoris: Secondary | ICD-10-CM | POA: Insufficient documentation

## 2017-01-10 DIAGNOSIS — Z7982 Long term (current) use of aspirin: Secondary | ICD-10-CM | POA: Diagnosis not present

## 2017-01-10 DIAGNOSIS — R7303 Prediabetes: Secondary | ICD-10-CM | POA: Diagnosis not present

## 2017-01-10 DIAGNOSIS — Z955 Presence of coronary angioplasty implant and graft: Secondary | ICD-10-CM | POA: Diagnosis not present

## 2017-01-10 DIAGNOSIS — R51 Headache: Secondary | ICD-10-CM | POA: Insufficient documentation

## 2017-01-10 DIAGNOSIS — R519 Headache, unspecified: Secondary | ICD-10-CM

## 2017-01-10 LAB — CBC
HCT: 39 % (ref 35.0–47.0)
HCT: 38.9 % (ref 36.0–46.0)
HEMOGLOBIN: 13 g/dL (ref 12.0–15.0)
Hemoglobin: 12.9 g/dL (ref 12.0–16.0)
MCH: 29.7 pg (ref 26.0–34.0)
MCH: 30 pg (ref 26.0–34.0)
MCHC: 33.1 g/dL (ref 32.0–36.0)
MCHC: 33.4 g/dL (ref 30.0–36.0)
MCV: 89.6 fL (ref 80.0–100.0)
MCV: 89.6 fL (ref 78.0–100.0)
PLATELETS: 150 10*3/uL (ref 150–440)
Platelets: 112 10*3/uL — ABNORMAL LOW (ref 150–400)
RBC: 4.35 MIL/uL (ref 3.80–5.20)
RBC: 4.34 MIL/uL (ref 3.87–5.11)
RDW: 13.9 % (ref 11.5–14.5)
RDW: 13 % (ref 11.5–15.5)
WBC: 4.5 10*3/uL (ref 3.6–11.0)
WBC: 4.1 10*3/uL (ref 4.0–10.5)

## 2017-01-10 LAB — COMPREHENSIVE METABOLIC PANEL
ALT: 21 U/L (ref 14–54)
AST: 28 U/L (ref 15–41)
Albumin: 4.4 g/dL (ref 3.5–5.0)
Alkaline Phosphatase: 59 U/L (ref 38–126)
Anion gap: 8 (ref 5–15)
BILIRUBIN TOTAL: 0.8 mg/dL (ref 0.3–1.2)
BUN: 13 mg/dL (ref 6–20)
CO2: 27 mmol/L (ref 22–32)
CREATININE: 0.74 mg/dL (ref 0.44–1.00)
Calcium: 9.5 mg/dL (ref 8.9–10.3)
Chloride: 105 mmol/L (ref 101–111)
GFR calc Af Amer: >60 mL/min (ref >60)
Glucose, Bld: 83 mg/dL (ref 65–99)
POTASSIUM: 4.2 mmol/L (ref 3.5–5.1)
Sodium: 140 mmol/L (ref 135–145)
TOTAL PROTEIN: 7 g/dL (ref 6.5–8.1)

## 2017-01-10 LAB — BASIC METABOLIC PANEL
ANION GAP: 9 (ref 5–15)
BUN: 13 mg/dL (ref 6–20)
CHLORIDE: 101 mmol/L (ref 101–111)
CO2: 27 mmol/L (ref 22–32)
CREATININE: 0.91 mg/dL (ref 0.44–1.00)
Calcium: 9 mg/dL (ref 8.9–10.3)
GFR calc non Af Amer: >60 mL/min (ref >60)
Glucose, Bld: 93 mg/dL (ref 65–99)
Potassium: 3.7 mmol/L (ref 3.5–5.1)
SODIUM: 137 mmol/L (ref 135–145)

## 2017-01-10 LAB — SEDIMENTATION RATE
SED RATE: 9 mm/h (ref 0–22)
Sed Rate: 10 mm/hr (ref 0–30)

## 2017-01-10 MED ORDER — KETOROLAC TROMETHAMINE 60 MG/2ML IM SOLN
60.0000 mg | Freq: Once | INTRAMUSCULAR | Status: AC
  Administered 2017-01-10: 60 mg via INTRAMUSCULAR

## 2017-01-10 MED ORDER — DIPHENHYDRAMINE HCL 50 MG/ML IJ SOLN
25.0000 mg | Freq: Once | INTRAMUSCULAR | Status: AC
  Administered 2017-01-10: 25 mg via INTRAVENOUS
  Filled 2017-01-10: qty 1

## 2017-01-10 MED ORDER — SODIUM CHLORIDE 0.9 % IV BOLUS (SEPSIS)
1000.0000 mL | Freq: Once | INTRAVENOUS | Status: AC
  Administered 2017-01-10: 1000 mL via INTRAVENOUS

## 2017-01-10 MED ORDER — METOCLOPRAMIDE HCL 5 MG/ML IJ SOLN
10.0000 mg | Freq: Once | INTRAMUSCULAR | Status: AC
  Administered 2017-01-10: 10 mg via INTRAVENOUS
  Filled 2017-01-10: qty 2

## 2017-01-10 MED ORDER — KETOROLAC TROMETHAMINE 30 MG/ML IJ SOLN
30.0000 mg | Freq: Once | INTRAMUSCULAR | Status: AC
  Administered 2017-01-10: 30 mg via INTRAVENOUS
  Filled 2017-01-10: qty 1

## 2017-01-10 MED ORDER — BUTALBITAL-APAP-CAFFEINE 50-325-40 MG PO TABS: 1.0000 | ORAL_TABLET | Freq: Three times a day (TID) | ORAL | 0 refills | Status: AC | PRN

## 2017-01-10 NOTE — ED Notes (Signed)
ED Provider at bedside. 

## 2017-01-10 NOTE — ED Triage Notes (Addendum)
Pt reports L-sided headache with associated blurry vision and light-sensitivity x1wk. Denies known injury, n/v. Pt alert, oriented, face symmetrical, grips equal. Pt states she was seen at [facility] today and was instructed to come to ED for CT scan.

## 2017-01-10 NOTE — ED Notes (Signed)
Patient c/o headache to her left side and pain behind her left eye.  Right eye is also red.

## 2017-01-10 NOTE — ED Provider Notes (Signed)
[facility] Provider Note   CSN: [id number] Arrival date & time: 01/10/17  1614     History   Chief Complaint Chief Complaint  Patient presents with  . Headache    HPI Emma Combs is a 61 y.o. female.  HPI  61 y.o. female with a hx of CAD, presents to the Emergency Department today due to intermittent headache x 1 week. Left sided and throbbing sensation. Onset was gradual. States initially she had N/V symptoms with associated vertigo from lying flat, but this has since resolved. Ever since this incident, patient has had intermittent headaches isolated to left temple with associated blurred vision in left eye. Denies pain to eye. Notes photophobia with onset of headaches isolated to left eye. No CP/SOB. No syncope. Denies unilateral weakness or numbness. No fevers. Pt was seen at [facility] and sent here for further evaluation with request for CT scan of head without contrast. No other symptoms noted.   Past Medical History:  Diagnosis Date  . Anginal pain ([place])   . Arthritis    "joints ache; hips" (02/28/2014)  . Borderline diabetes mellitus   . Coronary atherosclerosis of native coronary artery    08/12/2013: PTCA and stenting of the proximal and mid LAD with overlapping 3.0 x 28 mm in the proximal to mid segment and the ostial 3.0 x 12 mm Promus Premier drug-eluting stent. 02/28/2014: Stent distal RCA 2.75x15 Xience Alpine DES. Mod disease in Ramus intermediate. EF 55%  . Glucose intolerance (impaired glucose tolerance)   . High cholesterol   . Obesity   . Uterine fibroid     Patient Active Problem List   Diagnosis Date Noted  . Coronary atherosclerosis of native coronary artery 08/12/2013  . S/P PTCA (percutaneous transluminal coronary angioplasty) 08/11/2013  . Obesity 08/24/2011  . Glucose intolerance (impaired glucose tolerance) 08/24/2011    Past Surgical History:  Procedure Laterality Date  . CORONARY ANGIOPLASTY WITH STENT PLACEMENT  08/11/2013; 02/28/2014   "2 + 1"  . CORONARY STENT PLACEMENT  08/11/2013  . HEMORRHOID BANDING  2009  . LEFT AND RIGHT HEART CATHETERIZATION WITH CORONARY ANGIOGRAM N/A 08/11/2013   Procedure: LEFT AND RIGHT HEART CATHETERIZATION WITH CORONARY ANGIOGRAM;  Surgeon: Laverda Page, MD;  Location: [facility] CATH LAB;  Service: Cardiovascular;  Laterality: N/A;  . LEFT HEART CATHETERIZATION WITH CORONARY ANGIOGRAM N/A 02/28/2014   Procedure: LEFT HEART CATHETERIZATION WITH CORONARY ANGIOGRAM;  Surgeon: Laverda Page, MD;  Location: [facility] CATH LAB;  Service: Cardiovascular;  Laterality: N/A;  . PERCUTANEOUS CORONARY STENT INTERVENTION (PCI-S)  02/28/2014   Procedure: PERCUTANEOUS CORONARY STENT INTERVENTION (PCI-S);  Surgeon: Laverda Page, MD;  Location: [facility] CATH LAB;  Service: Cardiovascular;;  2.75 x 58m Xience DEstent distal RCA  . TEE WITHOUT CARDIOVERSION N/A 09/06/2013   Procedure: TRANSESOPHAGEAL ECHOCARDIOGRAM (TEE);  Surgeon: JLaverda Page MD;  Location: [facility]  Service: Cardiovascular;  Laterality: N/A;  . TUBAL LIGATION  1992    OB History    No data available       Home Medications    Prior to Admission medications   Medication Sig Start Date End Date Taking? Authorizing Provider  ALOE VERA PO Take 8 oz by mouth daily.   Yes [provider]  aspirin 81 MG chewable tablet Chew 1 tablet (81 mg total) by mouth daily. Patient taking differently: Chew 162 mg by mouth daily.  08/12/13  Yes GAdrian Prows MD  atorvastatin (LIPITOR) 80 MG tablet Take 1 tablet (80 mg total)  by mouth daily at 6 PM. Patient taking differently: Take 40 mg by mouth daily.  08/12/13  Yes Adrian Prows, MD  Coenzyme Q10 (Q-10 CO-ENZYME PO) Take 1 packet by mouth daily.   Yes [provider]  hydrocortisone (ANUSOL-HC) 25 MG suppository Place 1 suppository (25 mg total) rectally 2 (two) times daily as needed for hemorrhoids. 12/19/15  Yes Shawnee Knapp, MD  metoprolol succinate (TOPROL-XL) 50 MG 24 hr tablet Take 1  tablet (50 mg total) by mouth daily. 08/12/13  Yes Adrian Prows, MD  Multiple Vitamin (MULTIVITAMIN WITH MINERALS) TABS tablet Take 2 tablets by mouth daily.   Yes [provider]  Naltrexone-Bupropion HCl ER (CONTRAVE) 8-90 MG TB12 Take by mouth.   Yes [provider]  nitroGLYCERIN (NITROSTAT) 0.4 MG SL tablet Place 0.4 mg under the tongue every 5 (five) minutes as needed for chest pain.   Yes [provider]  Omega-3 Fatty Acids (OMEGA 3 PO) Take 2 capsules by mouth daily with breakfast.   Yes [provider]  amoxicillin-clavulanate (AUGMENTIN) 875-125 MG tablet Take 1 tablet by mouth 2 (two) times daily.    [provider]  butalbital-acetaminophen-caffeine (FIORICET, ESGIC) 50-325-40 MG tablet Take 1 tablet by mouth every 8 (eight) hours as needed for headache. Use no more than one or two per day 01/10/17 01/10/18  Emeterio Reeve, DO    Family History Family History  Problem Relation Age of Onset  . Hyperlipidemia Mother   . Cancer Mother        leukemia  . Diabetes Mother   . Diabetes Father   . Hyperlipidemia Father   . Stroke Father   . Cancer Sister   . Diabetes Sister   . Hyperlipidemia Sister   . Hypertension Sister   . Hypertension Brother   . Hyperlipidemia Brother   . Diabetes Brother     Social History Social History  Substance Use Topics  . Smoking status: Never Smoker  . Smokeless tobacco: Never Used  . Alcohol use 4.2 oz/week    7 Glasses of wine per week     Allergies   Bactrim   Review of Systems Review of Systems ROS reviewed and all are negative for acute change except as noted in the HPI.  Physical Exam Updated Vital Signs BP 117/78 (BP Location: Left Arm)   Pulse 79   Temp 98.3 F (36.8 C) (Oral)   Resp 17   SpO2 100%   Physical Exam  Constitutional: She is oriented to person, place, and time. Vital signs are normal. She appears well-developed and well-nourished.  HENT:  Head: Normocephalic  and atraumatic.  Right Ear: Hearing normal.  Left Ear: Hearing normal.  Non TTP along bilateral temples   Eyes: Pupils are equal, round, and reactive to light. Conjunctivae and EOM are normal.  Neck: Normal range of motion.  Cardiovascular: Normal rate, regular rhythm, normal heart sounds and intact distal pulses.   Pulmonary/Chest: Effort normal and breath sounds normal.  Abdominal: Soft.  Musculoskeletal: Normal range of motion.  Neurological: She is alert and oriented to person, place, and time. She has normal strength. No cranial nerve deficit or sensory deficit.  Cranial Nerves:  II: Pupils equal, round, reactive to light III,IV, VI: ptosis not present, extra-ocular motions intact bilaterally  V,VII: smile symmetric, facial light touch sensation equal VIII: hearing grossly normal bilaterally  IX,X: midline uvula rise  XI: bilateral shoulder shrug equal and strong XII: midline tongue extension Negative pronator drift Finger to  nose exam unremarkable   Skin: Skin is warm and dry.  Psychiatric: She has a normal mood and affect. Her speech is normal and behavior is normal. Thought content normal.  Nursing note and vitals reviewed.    ED Treatments / Results  Labs (all labs ordered are listed, but only abnormal results are displayed) Labs Reviewed  CBC - Abnormal; Notable for the following:       Result Value   Platelets 112 (*)    All other components within normal limits  BASIC METABOLIC PANEL  SEDIMENTATION RATE    EKG  EKG Interpretation None       Radiology Ct Head Wo Contrast  Result Date: 01/10/2017 CLINICAL DATA:  Headache, dizziness. EXAM: CT HEAD WITHOUT CONTRAST TECHNIQUE: Contiguous axial images were obtained from the base of the skull through the vertex without intravenous contrast. COMPARISON:  None. FINDINGS: Brain: No evidence of acute infarction, hemorrhage, hydrocephalus, extra-axial collection or mass lesion/mass effect. Vascular: No hyperdense  vessel or unexpected calcification. Skull: Normal. Negative for fracture or focal lesion. Sinuses/Orbits: No acute finding. Other: None. IMPRESSION: Normal head CT. Electronically Signed   By: Marijo Conception, M.D.   On: 01/10/2017 17:50    Procedures Procedures (including critical care time)  Medications Ordered in ED Medications - No data to display   Initial Impression / Assessment and Plan / ED Course  I have reviewed the triage vital signs and the nursing notes.  Pertinent labs & imaging results that were available during my care of the patient were reviewed by me and considered in my medical decision making (see chart for details).  Final Clinical Impressions(s) / ED Diagnoses  {I have reviewed and evaluated the relevant laboratory values. {I have reviewed and evaluated the relevant imaging studies.  {I have reviewed the relevant previous healthcare records.  {I obtained HPI from historian. {Patient discussed with supervising physician.  ED Course:  Assessment: Pt is a 61 y.o. female with a hx of CAD, presents to the Emergency Department today due to intermittent headache x 1 week. Left sided and throbbing sensation. Onset was gradual. States initially she had N/V symptoms with associated vertigo from lying flat, but this has since resolved. Ever since this incident, patient has had intermittent headaches isolated to left temple with associated blurred vision in left eye. Denies pain to eye. Notes photophobia with onset of headaches isolated to left eye. No CP/SOB. No syncope. Denies unilateral weakness or numbness. No fevers. Pt was seen at [facility] and sent here for further evaluation with request for CT scan of head without contrast.  On exam, pt in NAD. Nontoxic/nonseptic appearing. VSS. Afebrile. Lungs CTA. Heart RRR. Patient has a normal complete neurological exam, normal vital signs, normal level of consciousness, no signs of meningismus, is well-appearing/non-toxic appearing, no signs of  trauma. No papilledema, no pain over the temporal arteries.. Labs unremarkable. ESR negative. CT Head unremarkable. Given Migraine cocktail with relief of symptoms. Plan is to DC home with follow up to PCP. At time of discharge, Patient is in no acute distress. Vital Signs are stable. Patient is able to ambulate. Patient able to tolerate PO.   Disposition/Plan:  DC Home Additional Verbal discharge instructions given and discussed with patient.  Pt Instructed to f/u with PCP in the next week for evaluation and treatment of symptoms. Return precautions given Pt acknowledges and agrees with plan  Supervising Physician Orlie Dakin, MD  Final diagnoses:  Acute nonintractable headache, unspecified headache type  New Prescriptions New Prescriptions   No medications on file     Shary Decamp, Hershal Coria 01/10/17 [place], MD 01/11/17 [phone]

## 2017-01-10 NOTE — Discharge Instructions (Signed)
Please read and follow all provided instructions.  Your diagnoses today include:  1. Acute nonintractable headache, unspecified headache type     Tests performed today include: CT of your head which was normal and did not show any serious cause of your headache Vital signs. See below for your results today.   Medications:  In the Emergency Department you received: Reglan - antinausea/headache medication Benadryl - antihistamine to counteract potential side effects of reglan Toradol - NSAID medication similar to ibuprofen  Take any prescribed medications only as directed.  Additional information:  Follow any educational materials contained in this packet.  You are having a headache. No specific cause was found today for your headache. It may have been a migraine or other cause of headache. Stress, anxiety, fatigue, and depression are common triggers for headaches.   Your headache today does not appear to be life-threatening or require hospitalization, but often the exact cause of headaches is not determined in the emergency department. Therefore, follow-up with your doctor is very important to find out what may have caused your headache and whether or not you need any further diagnostic testing or treatment.   Sometimes headaches can appear benign (not harmful), but then more serious symptoms can develop which should prompt an immediate re-evaluation by your doctor or the emergency department.  BE VERY CAREFUL not to take multiple medicines containing Tylenol (also called acetaminophen). Doing so can lead to an overdose which can damage your liver and cause liver failure and possibly death.   Follow-up instructions: Please follow-up with your primary care provider in the next 3 days for further evaluation of your symptoms.   Return instructions:  Please return to the Emergency Department if you experience worsening symptoms. Return if the medications do not resolve your headache, if  it recurs, or if you have multiple episodes of vomiting or cannot keep down fluids. Return if you have a change from the usual headache. RETURN IMMEDIATELY IF you: Develop a sudden, severe headache Develop confusion or become poorly responsive or faint Develop a fever above 100.38F or problem breathing Have a change in speech, vision, swallowing, or understanding Develop new weakness, numbness, tingling, incoordination in your arms or legs Have a seizure Please return if you have any other emergent concerns.  Additional Information:  Your vital signs today were: BP (!) 144/78 (BP Location: Right Arm)    Pulse 70    Temp 98.3 F (36.8 C) (Oral)    Resp 18    SpO2 98%  If your blood pressure (BP) was elevated above 135/85 this visit, please have this repeated by your doctor within one month. --------------

## 2017-01-10 NOTE — ED Provider Notes (Signed)
Complains of left-sided temporal headache gradual onset 1 week ago. Headache is sharp and throbbing. Associated symptoms include photophobia and some nausea. She feels much improved since treatment here. Patient alert Glasgow Coma Score 15 appears in no distress cranial nerves II through XII grossly intact. Moves all extremities well   Orlie Dakin, MD 01/10/17 [phone]

## 2017-01-10 NOTE — Patient Instructions (Addendum)
Plan:  Labs and CT of the head today - please wait at [facility] until the results are back and I can talk to you about the next steps over the phone while you are there    Pain medication in the office today  If scan is okay, I would like you to fill the prescription I gave you - use this sparingly, it can cause some sedation or dizziness   I'd like you to come back on Monday to establish care with a doctor or PA here, and to have your symptoms rechecked  I still recommend getting in touch with your eye doctor to have them evaluate this as well   If you get worse or if something changes, please go to a hospital or emergency room right away!     IF you received an x-ray today, you will receive an invoice from [facility]. Please contact [facility] at [phone] with questions or concerns regarding your invoice.   IF you received labwork today, you will receive an invoice from [facility]. Please contact [facility] at [phone] with questions or concerns regarding your invoice.   Our billing staff will not be able to assist you with questions regarding bills from these companies.  You will be contacted with the lab results as soon as they are available. The fastest way to get your results is to activate your My Chart account. Instructions are located on the last page of this paperwork. If you have not heard from [place] regarding the results in 2 weeks, please contact this office.

## 2017-01-10 NOTE — ED Notes (Signed)
Pt in CT.

## 2017-01-10 NOTE — Progress Notes (Signed)
HPI: Emma Combs is a 61 y.o. female  who presents to [place] at [facility] today, 01/10/17,  for chief complaint of:  Chief Complaint  Patient presents with  . Eye Pain    Left eye x 1 week. Hard to open it normally     Patient reports history of left-sided temporal/frontal/occipital headache, radiating down into neck/shoulder.   This is been ongoing overall for about a month but worse over the past week or so.   Associated with left eye photosensitivity, patient states it hurts when she has her eye open in the bright sunlight and this is the main reason that she came here today.   No skin sensitivity in temporal region, no stabbing pain, no waking from sleep. However, patient does not have a history of headaches previously.   She has tried no medications for this over-the-counter.   She had one episode of dizziness a few days ago which has since resolved  She had one episode of vomiting last week after trying to treat the headache with streaking up the water and significant amount of aloe vera juice, she states that she felt better after throwing up.   Past medical, surgical, social and family history reviewed: Patient Active Problem List   Diagnosis Date Noted  . Coronary atherosclerosis of native coronary artery 08/12/2013  . S/P PTCA (percutaneous transluminal coronary angioplasty) 08/11/2013  . Obesity 08/24/2011  . Glucose intolerance (impaired glucose tolerance) 08/24/2011   Past Surgical History:  Procedure Laterality Date  . CORONARY ANGIOPLASTY WITH STENT PLACEMENT  08/11/2013; 02/28/2014   "2 + 1"  . CORONARY STENT PLACEMENT  08/11/2013  . HEMORRHOID BANDING  2009  . LEFT AND RIGHT HEART CATHETERIZATION WITH CORONARY ANGIOGRAM N/A 08/11/2013   Procedure: LEFT AND RIGHT HEART CATHETERIZATION WITH CORONARY ANGIOGRAM;  Surgeon: Laverda Page, MD;  Location: [facility] CATH LAB;  Service: Cardiovascular;  Laterality: N/A;  . LEFT HEART CATHETERIZATION WITH  CORONARY ANGIOGRAM N/A 02/28/2014   Procedure: LEFT HEART CATHETERIZATION WITH CORONARY ANGIOGRAM;  Surgeon: Laverda Page, MD;  Location: [facility] CATH LAB;  Service: Cardiovascular;  Laterality: N/A;  . PERCUTANEOUS CORONARY STENT INTERVENTION (PCI-S)  02/28/2014   Procedure: PERCUTANEOUS CORONARY STENT INTERVENTION (PCI-S);  Surgeon: Laverda Page, MD;  Location: [facility] CATH LAB;  Service: Cardiovascular;;  2.75 x 32mm Xience DEstent distal RCA  . TEE WITHOUT CARDIOVERSION N/A 09/06/2013   Procedure: TRANSESOPHAGEAL ECHOCARDIOGRAM (TEE);  Surgeon: Laverda Page, MD;  Location: [place];  Service: Cardiovascular;  Laterality: N/A;  . TUBAL LIGATION  1992   Social History  Substance Use Topics  . Smoking status: Never Smoker  . Smokeless tobacco: Never Used  . Alcohol use 4.2 oz/week    7 Glasses of wine per week   Family History  Problem Relation Age of Onset  . Hyperlipidemia Mother   . Cancer Mother        leukemia  . Diabetes Mother   . Diabetes Father   . Hyperlipidemia Father   . Stroke Father   . Cancer Sister   . Diabetes Sister   . Hyperlipidemia Sister   . Hypertension Sister   . Hypertension Brother   . Hyperlipidemia Brother   . Diabetes Brother      Current medication list and allergy/intolerance information reviewed:   Current Outpatient Prescriptions  Medication Sig Dispense Refill  . ALOE VERA PO Take 8 oz by mouth daily.    Marland Kitchen aspirin 81 MG chewable tablet Chew 1 tablet (  81 mg total) by mouth daily. (Patient taking differently: Chew 162 mg by mouth daily. )    . atorvastatin (LIPITOR) 80 MG tablet Take 1 tablet (80 mg total) by mouth daily at 6 PM. (Patient taking differently: Take 40 mg by mouth daily. ) 30 tablet 3  . Coenzyme Q10 (Q-10 CO-ENZYME PO) Take 1 packet by mouth daily.    . hydrocortisone (ANUSOL-HC) 25 MG suppository Place 1 suppository (25 mg total) rectally 2 (two) times daily as needed for hemorrhoids. 24 suppository 0  . metoprolol  succinate (TOPROL-XL) 50 MG 24 hr tablet Take 1 tablet (50 mg total) by mouth daily. 30 tablet 1  . Multiple Vitamin (MULTIVITAMIN WITH MINERALS) TABS tablet Take 2 tablets by mouth daily.    . Naltrexone-Bupropion HCl ER (CONTRAVE) 8-90 MG TB12 Take by mouth.    . nitroGLYCERIN (NITROSTAT) 0.4 MG SL tablet Place 0.4 mg under the tongue every 5 (five) minutes as needed for chest pain.    . Omega-3 Fatty Acids (OMEGA 3 PO) Take 2 capsules by mouth daily with breakfast.    . amoxicillin-clavulanate (AUGMENTIN) 875-125 MG tablet Take 1 tablet by mouth 2 (two) times daily.    Marland Kitchen HYDROcodone-acetaminophen (NORCO) 5-325 MG tablet Take 1 tablet by mouth every 6 (six) hours as needed for moderate pain. (Patient not taking: Reported on 01/10/2017) 20 tablet 0   No current facility-administered medications for this visit.    Allergies  Allergen Reactions  . Bactrim Other (See Comments)    Light headed, passing out      Review of Systems:  Constitutional:  No  fever, no chills, No recent illness, No unintentional weight changes. No significant fatigue.   HEENT: +headache, no vision change In terms of increased blurriness or visual field deficit/darkness/curtains drying but +photosensitivity, no hearing change, No sore throat, No  sinus pressure  Cardiac: No  chest pain, No  pressure  Respiratory:  No  shortness of breath. No  Cough  Gastrointestinal: No  abdominal pain, +nausea - resolved, +vomiting - resolved,   Musculoskeletal: No new myalgia/arthralgia  Genitourinary: No new  incontinence  Skin: No  Rash  Neurologic: No  weakness, +dizziness - resolved, No  slurred speech/focal weakness/facial droop  Psychiatric: No  concerns with depression, No  concerns with anxiety  Exam:  BP 127/76   Pulse 77   Temp 98.3 F (36.8 C) (Oral)   Resp 17   Ht 5' 4.5" (1.638 m)   Wt 211 lb 8 oz (95.9 kg)   SpO2 98%   BMI 35.74 kg/m   Constitutional: VS see above. General Appearance: alert,  well-developed, well-nourished, NAD  Eyes: Normal lids and conjunctive, non-icteric sclera  Ears, Nose, Mouth, Throat: MMM, Normal external inspection ears/nares/mouth/lips/gums.   Neck: No masses, trachea midline. No thyroid enlargement. No tenderness/mass appreciated. No lymphadenopathy. Normal range of motion to neck.  Respiratory: Normal respiratory effort. no wheeze, no rhonchi, no rales  Cardiovascular: S1/S2 normal, no murmur, no rub/gallop auscultated. RRR. No lower extremity edema. No carotid bruit   Gastrointestinal: Nontender, no masses.   Musculoskeletal: Gait normal. No clubbing/cyanosis of digits. Strength 5 out of 5 in all 4 extremities  Neurological: Normal balance/coordination. No tremor. No cranial nerve deficit on limited exam. Motor and sensation intact and symmetric. Cerebellar reflexes intact. Extraocular movements intact. PERRLA. No nystagmus. No temporal arterial bruit  Skin: warm, dry, intact.   Psychiatric: Normal judgment/insight. Normal mood and affect. Oriented x3.     ASSESSMENT/PLAN:   No  red flags on neurologic exam bright symptoms of new headache with increased sensitivity in 61 year old patient definitely warrants further workup.   We'll get labs today including stat sedimentation rate, will get stat CT head to rule out acute intracranial process. No horner's syndrome,   Will have patient follow-up with her ophthalmologist to evaluate possibly for glaucoma versus other eye disease.   Have her follow-up in this office early next week, preferably Monday, to establish care with PCP and monitor this condition, may require further workup with neurology referral/MRI if no improvement. Patient was given strict ER precautions and verbalizes understanding.  Photophobia of left eye - Plan: CT Head Wo Contrast, Sedimentation rate, Comprehensive metabolic panel, CBC, butalbital-acetaminophen-caffeine (FIORICET, ESGIC) 50-325-40 MG tablet  New daily persistent  headache - Plan: CT Head Wo Contrast, Sedimentation rate, Comprehensive metabolic panel, CBC, ketorolac (TORADOL) injection 60 mg, butalbital-acetaminophen-caffeine (FIORICET, ESGIC) 50-325-40 MG tablet    Patient Instructions   Plan:  Labs and CT of the head today - please wait at [facility] until the results are back and I can talk to you about the next steps over the phone while you are there    Pain medication in the office today  If scan is okay, I would like you to fill the prescription I gave you - use this sparingly, it can cause some sedation or dizziness   I'd like you to come back on Monday to establish care with a doctor or PA here, and to have your symptoms rechecked  I still recommend getting in touch with your eye doctor to have them evaluate this as well   If you get worse or if something changes, please go to a hospital or emergency room right away!     Visit summary with medication list and pertinent instructions was printed for patient to review. All questions at time of visit were answered - patient instructed to contact office with any additional concerns. ER/RTC precautions were reviewed with the patient. Follow-up plan: Return in about 2 days (around 01/12/2017) for recheck headache and establish with primary care provider .   Total time spent 40 minutes, greater than 50% of the visit was face to face counseling and coordinating care for diagnosis of The primary encounter diagnosis was Photophobia of left eye. A diagnosis of New daily persistent headache was also pertinent to this visit.  Marland Kitchen

## 2017-01-12 LAB — COMPREHENSIVE METABOLIC PANEL
ALBUMIN: 4.4 g/dL (ref 3.5–5.0)
ALT: 21 U/L (ref 14–54)
AST: 28 U/L (ref 15–41)
Alkaline Phosphatase: 59 U/L (ref 38–126)
BILIRUBIN TOTAL: 0.8 mg/dL (ref 0.3–1.2)
BUN: 13 mg/dL (ref 6–20)
CHLORIDE: 105 mmol/L (ref 101–111)
CO2: 27 mmol/L (ref 22–32)
Calcium: 9.5 mg/dL (ref 8.9–10.3)
Creatinine, Ser: 0.74 mg/dL (ref 0.44–1.00)
GFR calc non Af Amer: >60 mL/min (ref >60)
Glucose: 83 mg/dL (ref 65–99)
POTASSIUM: 4.2 mmol/L (ref 3.5–5.1)
SODIUM: 140 mmol/L (ref 135–145)
Total Protein: 7 g/dL (ref 6.5–8.1)

## 2017-01-12 LAB — SEDIMENTATION RATE: Sed Rate: 10 mm/hr (ref 0–30)

## 2017-01-12 LAB — CBC
HEMOGLOBIN: 12.9 g/dL (ref 12.0–16.0)
Hematocrit: 39 % (ref 35.0–47.0)
MCH: 29.7 pg (ref 26.0–34.0)
MCHC: 33.1 g/dL (ref 32.0–36.0)
MCV: 89.6 fL (ref 80.0–100.0)
PLATELETS: 150 10*3/uL (ref 150–440)
RBC: 4.35 MIL/uL (ref 3.80–5.20)
RDW: 13.9 % (ref 11.5–14.5)
WBC: 4.5 10*3/uL (ref 3.6–11.0)

## 2017-01-15 LAB — SEDIMENTATION RATE

## 2017-02-12 ENCOUNTER — Telehealth: Payer: Self-pay | Admitting: Family Medicine

## 2017-02-12 NOTE — Telephone Encounter (Signed)
PT CALLING WANTING SOMEONE TO GIVE HER A CALL REGARDING HER CT THAT SHE HAD IN July DR ALEXANDER SENT HER FOR A CT IN [place] AND THE PATIENT IS UPSET ABOUT BILL BEING OVER $5000 SHE STATES THAT THE CT PLACE THAT SHE WENT TO CLAIM THAT WE SENT HER AS AN URGENT REFERRAL THAT'S WHY IT WAS SO HIGH

## 2017-02-18 NOTE — Telephone Encounter (Signed)
LMVM for patient to CB.

## 2017-09-23 ENCOUNTER — Encounter: Payer: Self-pay | Admitting: Physician Assistant

## 2018-09-08 ENCOUNTER — Other Ambulatory Visit: Payer: Self-pay

## 2018-09-08 MED ORDER — ATORVASTATIN CALCIUM 80 MG PO TABS: 40.0000 mg | ORAL_TABLET | Freq: Every day | ORAL | 1 refills | Status: DC

## 2018-12-08 ENCOUNTER — Other Ambulatory Visit: Payer: Self-pay

## 2018-12-08 DIAGNOSIS — Z9861 Coronary angioplasty status: Secondary | ICD-10-CM

## 2018-12-08 MED ORDER — METOPROLOL SUCCINATE ER 50 MG PO TB24: 50.0000 mg | ORAL_TABLET | Freq: Every day | ORAL | 1 refills | Status: DC

## 2018-12-20 ENCOUNTER — Other Ambulatory Visit: Payer: Self-pay

## 2018-12-20 MED ORDER — ATORVASTATIN CALCIUM 40 MG PO TABS: 40.0000 mg | ORAL_TABLET | Freq: Every day | ORAL | 1 refills | Status: DC

## 2019-01-27 ENCOUNTER — Ambulatory Visit: Payer: Self-pay | Admitting: Cardiology

## 2019-02-07 ENCOUNTER — Other Ambulatory Visit: Payer: Self-pay | Admitting: Cardiology

## 2019-02-07 DIAGNOSIS — Z9861 Coronary angioplasty status: Secondary | ICD-10-CM

## 2019-02-08 ENCOUNTER — Other Ambulatory Visit: Payer: Self-pay | Admitting: Cardiology

## 2019-02-09 LAB — COMPREHENSIVE METABOLIC PANEL
ALT: 17 IU/L (ref 0–32)
AST: 24 IU/L (ref 0–40)
Albumin/Globulin Ratio: 1.8 (ref 1.2–2.2)
Albumin: 4.4 g/dL (ref 3.8–4.8)
Alkaline Phosphatase: 61 IU/L (ref 39–117)
BUN/Creatinine Ratio: 12 (ref 12–28)
BUN: 10 mg/dL (ref 8–27)
Bilirubin Total: 0.5 mg/dL (ref 0.0–1.2)
CO2: 24 mmol/L (ref 20–29)
Calcium: 9.2 mg/dL (ref 8.7–10.3)
Chloride: 103 mmol/L (ref 96–106)
Creatinine, Ser: 0.84 mg/dL (ref 0.57–1.00)
GFR calc Af Amer: 86 mL/min/{1.73_m2} (ref >59)
GFR calc non Af Amer: 75 mL/min/{1.73_m2} (ref >59)
Globulin, Total: 2.4 g/dL (ref 1.5–4.5)
Glucose: 131 mg/dL — ABNORMAL HIGH (ref 65–99)
Potassium: 4.7 mmol/L (ref 3.5–5.2)
Sodium: 140 mmol/L (ref 134–144)
Total Protein: 6.8 g/dL (ref 6.0–8.5)

## 2019-02-09 LAB — LIPID PANEL W/O CHOL/HDL RATIO
Cholesterol, Total: 130 mg/dL (ref 100–199)
HDL: 67 mg/dL (ref >39)
LDL Calculated: 47 mg/dL (ref 0–99)
Triglycerides: 82 mg/dL (ref 0–149)
VLDL Cholesterol Cal: 16 mg/dL (ref 5–40)

## 2019-02-09 LAB — HGB A1C W/O EAG: Hgb A1c MFr Bld: 6.7 % — ABNORMAL HIGH (ref 4.8–5.6)

## 2019-02-09 LAB — TSH: TSH: 3.48 u[IU]/mL (ref 0.450–4.500)

## 2019-02-17 ENCOUNTER — Ambulatory Visit: Payer: Self-pay | Admitting: Cardiology

## 2019-03-18 ENCOUNTER — Encounter: Payer: Self-pay | Admitting: Cardiology

## 2019-03-18 ENCOUNTER — Other Ambulatory Visit: Payer: Self-pay

## 2019-03-18 ENCOUNTER — Ambulatory Visit: Payer: BC Managed Care – PPO | Admitting: Cardiology

## 2019-03-18 VITALS — BP 145/78 | HR 76 | Temp 97.7°F | Ht 64.5 in | Wt 218.9 lb

## 2019-03-18 DIAGNOSIS — I10 Essential (primary) hypertension: Secondary | ICD-10-CM

## 2019-03-18 DIAGNOSIS — E7800 Pure hypercholesterolemia, unspecified: Secondary | ICD-10-CM

## 2019-03-18 DIAGNOSIS — I2510 Atherosclerotic heart disease of native coronary artery without angina pectoris: Secondary | ICD-10-CM

## 2019-03-18 DIAGNOSIS — R7303 Prediabetes: Secondary | ICD-10-CM

## 2019-03-18 DIAGNOSIS — Z6837 Body mass index (BMI) 37.0-37.9, adult: Secondary | ICD-10-CM

## 2019-03-18 MED ORDER — LOSARTAN POTASSIUM-HCTZ 50-12.5 MG PO TABS: 1.0000 | ORAL_TABLET | ORAL | 3 refills | Status: DC

## 2019-03-18 NOTE — Progress Notes (Signed)
Primary Physician/Referring:  Donald Prose, MD  Patient ID: Emma Combs, female    DOB: [birth year], 63 y.o.   MRN: [id number]  Chief Complaint  Patient presents with  . Follow-up    lab results  . Coronary Artery Disease  . Hypertension   HPI:    Emma Combs  is a 63 y.o.  AAF with known coronary artery disease with history of proximal and mid LAD stent placed on 08/11/2013 and distal RCA stent placed on 02/28/2014, presenting with exertional chest pain the 1st time and syncope the 2nd time. She has hyperlipidemia, family history of premature coronary artery disease, hyperglycemia, obesity and mild chronic thrombocytopenia. She presents here for annual follow-up. Remains asymptomatic except for chronic mild exertional dyspnea. No change in weight.   Past Medical History:  Diagnosis Date  . Anginal pain ([place])   . Arthritis    "joints ache; hips" (02/28/2014)  . Borderline diabetes mellitus   . Coronary atherosclerosis of native coronary artery    08/12/2013: PTCA and stenting of the proximal and mid LAD with overlapping 3.0 x 28 mm in the proximal to mid segment and the ostial 3.0 x 12 mm Promus Premier drug-eluting stent. 02/28/2014: Stent distal RCA 2.75x15 Xience Alpine DES. Mod disease in Ramus intermediate. EF 55%  . Glucose intolerance (impaired glucose tolerance)   . High cholesterol   . Obesity   . Uterine fibroid    Past Surgical History:  Procedure Laterality Date  . CORONARY ANGIOPLASTY WITH STENT PLACEMENT  08/11/2013; 02/28/2014   "2 + 1"  . CORONARY STENT PLACEMENT  08/11/2013  . HEMORRHOID BANDING  2009  . LEFT AND RIGHT HEART CATHETERIZATION WITH CORONARY ANGIOGRAM N/A 08/11/2013   Procedure: LEFT AND RIGHT HEART CATHETERIZATION WITH CORONARY ANGIOGRAM;  Surgeon: Laverda Page, MD;  Location: [facility] CATH LAB;  Service: Cardiovascular;  Laterality: N/A;  . LEFT HEART CATHETERIZATION WITH CORONARY ANGIOGRAM N/A 02/28/2014   Procedure: LEFT HEART CATHETERIZATION WITH  CORONARY ANGIOGRAM;  Surgeon: Laverda Page, MD;  Location: [facility] CATH LAB;  Service: Cardiovascular;  Laterality: N/A;  . PERCUTANEOUS CORONARY STENT INTERVENTION (PCI-S)  02/28/2014   Procedure: PERCUTANEOUS CORONARY STENT INTERVENTION (PCI-S);  Surgeon: Laverda Page, MD;  Location: [facility] CATH LAB;  Service: Cardiovascular;;  2.75 x 31m Xience DEstent distal RCA  . TEE WITHOUT CARDIOVERSION N/A 09/06/2013   Procedure: TRANSESOPHAGEAL ECHOCARDIOGRAM (TEE);  Surgeon: JLaverda Page MD;  [place]  Service: Cardiovascular;  Laterality: N/A;  . TUBAL LIGATION  1992   Social History   Socioeconomic History  . Marital status: Married    Spouse name: Not on file  . Number of children: 5  . Years of education: Not on file  . Highest education level: Not on file  Occupational History    Employer: [employer] Social Needs  . Financial resource strain: Not on file  . Food insecurity    Worry: Not on file    Inability: Not on file  . Transportation needs    Medical: Not on file    Non-medical: Not on file  Tobacco Use  . Smoking status: Never Smoker  . Smokeless tobacco: Never Used  Substance and Sexual Activity  . Alcohol use: Yes    Alcohol/week: 7.0 standard drinks    Types: 7 Glasses of wine per week  . Drug use: No  . Sexual activity: Yes    Birth control/protection: Surgical  Lifestyle  . Physical activity    Days  per week: Not on file    Minutes per session: Not on file  . Stress: Not on file  Relationships  . Social Herbalist on phone: Not on file    Gets together: Not on file    Attends religious service: Not on file    Active member of club or organization: Not on file    Attends meetings of clubs or organizations: Not on file    Relationship status: Not on file  . Intimate partner violence    Fear of current or ex partner: Not on file    Emotionally abused: Not on file    Physically abused: Not on file    Forced sexual  activity: Not on file  Other Topics Concern  . Not on file  Social History Narrative   Marital status: married      Children: five children.       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      Exercise: none   ROS  Review of Systems  Constitution: Negative for chills, decreased appetite, malaise/fatigue and weight gain.  Cardiovascular: Positive for dyspnea on exertion. Negative for leg swelling and syncope.  Endocrine: Negative for cold intolerance.  Hematologic/Lymphatic: Does not bruise/bleed easily.  Musculoskeletal: Negative for joint swelling.  Gastrointestinal: Negative for abdominal pain, anorexia, change in bowel habit, hematochezia and melena.  Neurological: Negative for headaches and light-headedness.  Psychiatric/Behavioral: Negative for depression and substance abuse.  All other systems reviewed and are negative.  Objective   Vitals with BMI 03/18/2019 01/10/2017 01/10/2017  Height 5' 4.5" - -  Weight 218 lbs 14 oz - -  BMI 24.58 - -  Systolic 099 833 825  Diastolic 78 82 78  Pulse 76 67 70    Blood pressure (!) 145/78, pulse 76, temperature 97.7 F (36.5 C), height 5' 4.5" (1.638 m), weight 218 lb 14.4 oz (99.3 kg), SpO2 96 %. Body mass index is 36.99 kg/m.   Physical Exam  Constitutional:  Moderately built and moderately obese in no acute distress.  HENT:  Head: Atraumatic.  Eyes: Conjunctivae are normal.  Neck: Neck supple. No JVD present. No thyromegaly present.  Cardiovascular: Normal rate, regular rhythm, S1 normal, S2 normal, intact distal pulses and normal pulses. Exam reveals no gallop.  Murmur heard.  Early systolic murmur is present with a grade of 1/6 at the upper right sternal border and apex. Pulmonary/Chest: Effort normal and breath sounds normal.  Abdominal: Soft. Bowel sounds are normal.  Musculoskeletal: Normal range of motion.        General: No edema.  Neurological: She is alert.   Skin: Skin is warm and dry.  Psychiatric: She has a normal mood and affect.   Radiology: No results found.  Laboratory examination:   Recent Labs    02/08/19 0852  NA 140  K 4.7  CL 103  CO2 24  GLUCOSE 131*  BUN 10  CREATININE 0.84  CALCIUM 9.2  GFRNONAA 75  GFRAA 86   CMP Latest Ref Rng & Units 02/08/2019 01/10/2017 01/10/2017  Glucose 65 - 99 mg/dL 131(H) 93 83  BUN 8 - 27 mg/dL _0 Creatinine 0.57 - 1.00 mg/dL 0.84 0.91 0.74  Sodium 134 - 144 mmol/L 140 137 140  Potassium 3.5 - 5.2 mmol/L 4.7 3.7 4.2  Chloride 96 - 106 mmol/L 103 101 105  CO2 20 - 29 mmol/L _0 Calcium 8.7 - 10.3 mg/dL 9.2 9.0 9.5  Total Protein 6.0 - 8.5 g/dL 6.8 - 7.0  Total Bilirubin 0.0 - 1.2 mg/dL 0.5 - 0.8  Alkaline Phos 39 - 117 IU/L 61 - 59  AST 0 - 40 IU/L 24 - 28  ALT 0 - 32 IU/L 17 - 21   CBC Latest Ref Rng & Units 01/10/2017 01/10/2017 01/10/2017  WBC 4.0 - 10.5 K/uL 4.1 4.5 4.5  Hemoglobin 12.0 - 15.0 g/dL 13.0 12.9 12.9  Hematocrit 36.0 - 46.0 % 38.9 39.0 39.0  Platelets 150 - 400 K/uL 112(L) 150 150   Lipid Panel     Component Value Date/Time   CHOL 130 02/08/2019 0852   TRIG 82 02/08/2019 0852   HDL 67 02/08/2019 0852   CHOLHDL 3.0 08/11/2013 0950   VLDL 12 08/11/2013 0950   LDLCALC 47 02/08/2019 0852   HEMOGLOBIN A1C Lab Results  Component Value Date   HGBA1C 6.7 (H) 02/08/2019   TSH Recent Labs    02/08/19 0852  TSH 3.480   Medications and allergies   Allergies  Allergen Reactions  . Bactrim Other (See Comments)    Light headed, passing out     Prior to Admission medications   Medication Sig Start Date End Date Taking? Authorizing Provider  ALOE VERA PO Take 8 oz by mouth daily.   Yes [provider]  aspirin 81 MG chewable tablet Chew 1 tablet (81 mg total) by mouth daily. Patient taking differently: Chew 162 mg by mouth daily.  08/12/13  Yes Adrian Prows, MD  atorvastatin (LIPITOR) 40 MG tablet Take 1 tablet (40 mg total) by mouth daily.  12/20/18  Yes Adrian Prows, MD  Coenzyme Q10 (Q-10 CO-ENZYME PO) Take 1 packet by mouth daily.   Yes [provider]  hydrocortisone (ANUSOL-HC) 25 MG suppository Place 1 suppository (25 mg total) rectally 2 (two) times daily as needed for hemorrhoids. 12/19/15  Yes Shawnee Knapp, MD  Iron-Vitamins (VITALIZE PO) Take 2 capsules by mouth daily at 2 PM.   Yes [provider]  metoprolol succinate (TOPROL-XL) 50 MG 24 hr tablet TAKE 1 TABLET(50 MG) BY MOUTH DAILY 02/07/19  Yes Adrian Prows, MD  Multiple Vitamin (MULTIVITAMIN WITH MINERALS) TABS tablet Take 2 tablets by mouth daily.   Yes [provider]  nitroGLYCERIN (NITROSTAT) 0.4 MG SL tablet Place 0.4 mg under the tongue every 5 (five) minutes as needed for chest pain.   Yes [provider]  Omega-3 Fatty Acids (OMEGA 3 PO) Take 2 capsules by mouth daily with breakfast.   Yes [provider]     Current Outpatient Medications  Medication Instructions  . ALOE VERA PO 8 oz, Daily  . aspirin 81 mg, Oral, Daily  . atorvastatin (LIPITOR) 40 mg, Oral, Daily  . Coenzyme Q10 (Q-10 CO-ENZYME PO) 1 packet, Daily  . hydrocortisone (ANUSOL-HC) 25 mg, Rectal, 2 times daily PRN  . Iron-Vitamins (VITALIZE PO) 2 capsules, Oral, Daily  . losartan-hydrochlorothiazide (HYZAAR) 50-12.5 MG tablet 1 tablet, Oral, BH-each morning  . metoprolol succinate (TOPROL-XL) 50 MG 24 hr tablet TAKE 1 TABLET(50 MG) BY MOUTH DAILY  . Multiple Vitamin (MULTIVITAMIN WITH MINERALS) TABS tablet 2 tablets, Daily  . nitroGLYCERIN (NITROSTAT) 0.4 mg, Every 5 min PRN  . Omega-3 Fatty Acids (OMEGA 3 PO) 2 capsules, Daily with breakfast    Cardiac Studies:   Exercise sestamibi stress test 08/01/2013: 1. Resting EKG showed normal  sinus rhythm, poor R wave progression, Stress EKG was positive for ischemia. Patient had frequent PVCs, ventricular bigeminy at peak exercise. Patient had 2 mm horizontal ST segment depression 3 minutes into recovery that  persisted for 5.5 minutes into recovery. Patient exercised on BRUCE PROTOCOL for 5 minutes 58 seconds. The maximum work level achieved was 7.3 MET's. The test was terminated due to achievement of the target heart rate. 2. Perfusion imaging study demonstrates a moderate to large size severe septal ischemia extending from the base to the apex and includes apical anterior and apex. This reached statistical significance. Dynamic QGS images reveal septal hypokinesis and apical hypokinesis. Left ventricular ejection fraction was estimated to be 52%. This represents a high risk study, consider further cardiac workup including coronary angiography if clinically indicated.  Cardiac cath 02/28/2014: Distal RCA 2.75x61m Xience DES. EF 55%. 08/11/2013: Stenting ostial and proximal and mid LAD with overlapping 3.0x28, 3.0x12 Promus premier DES. Large Ramus intermediate with proximal 40% stenosis.  Echocardiogram 06/05/2016: Left ventricle cavity is normal in size. Normal global wall motion. Doppler evidence of grade I (impaired) diastolic dysfunction, normal LAP. Calculated EF 55%. Mild (Grade I) mitral regurgitation. Mild tricuspid regurgitation. No evidence of pulmonary hypertension. Mild pulmonic regurgitation  Assessment     ICD-10-CM   1. Coronary artery disease involving native coronary artery of native heart without angina pectoris  I25.10 EKG 12-Lead  2. Hypercholesteremia  E78.00   3. Prediabetes  R73.03   4. Essential hypertension  I10 losartan-hydrochlorothiazide (HYZAAR) 50-12.5 MG tablet    Basic metabolic panel  5. Class 2 severe obesity due to excess calories with serious comorbidity and body mass index (BMI) of 37.0 to 37.9 in adult ([facility]  E66.01    Z68.37     EKG 03/18/2019: Normal sinus rhythm at rate of 72 bpm, normal axis.  Nonspecific T-wave flattening in 1 and aVL.  Normal QT interval.    Recommendations:   Patient is here on annual visit and follow-up of coronary artery  disease, hypertension, mixed hyperlipidemia and also hyperglycemia.  I reviewed the results of the recently performed labs with the patient extensively.  Lipids are well controlled, I did discuss with her regarding the concerns for elevated serum glucose and also obesity.  Her blood pressures remained elevated, she has noticed her systolic blood pressure to be high at home as well.  This is probably related to her diet, she is also been drinking wine in the afternoon as she is working from home.  This may be contributing to her elevated blood pressure.  In view of prediabetes mellitus, I started her on losartan HCT 50/12.5 g in the morning, we'll obtain a BMP.  I have faxed the previously performed labs to her PCP as well.  I'd like to see her back in 6 weeks for hypertension and obesity management, if she remains stable I'll continue to see her on an annual basis.   I spent 25 minutes face-to-face with her discussing the above.  JAdrian Prows MD, F[facility]10/07/2018, 3:18 PM PSeven SpringsCardiovascular. PEllstonPager: [phone] Office: [phone] no answer Cell [phone]

## 2019-04-08 ENCOUNTER — Other Ambulatory Visit: Payer: Self-pay

## 2019-04-08 DIAGNOSIS — Z9861 Coronary angioplasty status: Secondary | ICD-10-CM

## 2019-04-08 MED ORDER — METOPROLOL SUCCINATE ER 50 MG PO TB24: 50.0000 mg | ORAL_TABLET | Freq: Every day | ORAL | 2 refills | Status: DC

## 2019-04-16 LAB — BASIC METABOLIC PANEL
BUN/Creatinine Ratio: 12 (ref 12–28)
BUN: 11 mg/dL (ref 8–27)
CO2: 27 mmol/L (ref 20–29)
Calcium: 9.5 mg/dL (ref 8.7–10.3)
Chloride: 102 mmol/L (ref 96–106)
Creatinine, Ser: 0.91 mg/dL (ref 0.57–1.00)
GFR calc Af Amer: 78 mL/min/{1.73_m2} (ref >59)
GFR calc non Af Amer: 68 mL/min/{1.73_m2} (ref >59)
Glucose: 149 mg/dL — ABNORMAL HIGH (ref 65–99)
Potassium: 4.4 mmol/L (ref 3.5–5.2)
Sodium: 141 mmol/L (ref 134–144)

## 2019-04-27 ENCOUNTER — Encounter: Payer: Self-pay | Admitting: Cardiology

## 2019-04-27 ENCOUNTER — Ambulatory Visit: Payer: BC Managed Care – PPO | Admitting: Cardiology

## 2019-04-27 ENCOUNTER — Other Ambulatory Visit: Payer: Self-pay

## 2019-04-27 VITALS — BP 123/69 | HR 85 | Temp 97.4°F | Ht 64.5 in | Wt 218.0 lb

## 2019-04-27 DIAGNOSIS — I2510 Atherosclerotic heart disease of native coronary artery without angina pectoris: Secondary | ICD-10-CM | POA: Diagnosis not present

## 2019-04-27 DIAGNOSIS — E119 Type 2 diabetes mellitus without complications: Secondary | ICD-10-CM

## 2019-04-27 DIAGNOSIS — E7800 Pure hypercholesterolemia, unspecified: Secondary | ICD-10-CM

## 2019-04-27 DIAGNOSIS — I10 Essential (primary) hypertension: Secondary | ICD-10-CM

## 2019-04-27 NOTE — Progress Notes (Signed)
Primary Physician/Referring:  Donald Prose, MD  Patient ID: Emma Combs, female    DOB: March 16, 1956, 63 y.o.   MRN: [id number]  Chief Complaint  Patient presents with  . Hypertension   HPI:    Emma Combs  is a 63 y.o.  AAF with known coronary artery disease with history of proximal and mid LAD stent placed on 08/11/2013 and distal RCA stent placed on 02/28/2014, presenting with exertional chest pain the 1st time and syncope the 2nd time. She has hyperlipidemia, family history of premature coronary artery disease, hyperglycemia, obesity and mild chronic thrombocytopenia. She presents here for annual follow-up. Remains asymptomatic except for chronic mild exertional dyspnea. No change in weight.   Heart last office visit 6 weeks ago, had added losartan HCT for hypertension control and also discussed regarding lifestyle changes and statins where we added, she was taking atorvastatin on a regular basis.  She now presents for follow-up, has not had any side effects from losartan or taking Lipitor regularly.  No chest pain, no dyspnea, continues to have difficulty with weight loss.  Past Medical History:  Diagnosis Date  . Anginal pain ([place])   . Arthritis    "joints ache; hips" (02/28/2014)  . Borderline diabetes mellitus   . Coronary atherosclerosis of native coronary artery    08/12/2013: PTCA and stenting of the proximal and mid LAD with overlapping 3.0 x 28 mm in the proximal to mid segment and the ostial 3.0 x 12 mm Promus Premier drug-eluting stent. 02/28/2014: Stent distal RCA 2.75x15 Xience Alpine DES. Mod disease in Ramus intermediate. EF 55%  . Glucose intolerance (impaired glucose tolerance)   . High cholesterol   . Obesity   . Uterine fibroid    Past Surgical History:  Procedure Laterality Date  . CORONARY ANGIOPLASTY WITH STENT PLACEMENT  08/11/2013; 02/28/2014   "2 + 1"  . CORONARY STENT PLACEMENT  08/11/2013  . HEMORRHOID BANDING  2009  . LEFT AND RIGHT HEART CATHETERIZATION  WITH CORONARY ANGIOGRAM N/A 08/11/2013   Procedure: LEFT AND RIGHT HEART CATHETERIZATION WITH CORONARY ANGIOGRAM;  Surgeon: Laverda Page, MD;  Location: [facility] CATH LAB;  Service: Cardiovascular;  Laterality: N/A;  . LEFT HEART CATHETERIZATION WITH CORONARY ANGIOGRAM N/A 02/28/2014   Procedure: LEFT HEART CATHETERIZATION WITH CORONARY ANGIOGRAM;  Surgeon: Laverda Page, MD;  Location[facility] C[facility] LAB;  Service: Cardiovascular;  Laterality: N/A;  . PERCUTANEOUS CORONARY STENT INTERVENTION (PCI-S)  02/28/2014   Procedure: PERCUTANEOUS CORONARY STENT INTERVENTION (PCI-S);  Surgeon: Laverda Page, MD;  Location: [facility] CATH LAB;  Service: Cardiovascular;;  2.75 x 79m Xience DEstent distal RCA  . TEE WITHOUT CARDIOVERSION N/A 09/06/2013   Procedure: TRANSESOPHAGEAL ECHOCARDIOGRAM (TEE);  Surgeon: JLaverda Page MD;  [facility]  Service: Cardiovascular;  Laterality: N/A;  . TUBAL LIGATION  1992   Social History   Socioeconomic History  . Marital status: Married    Spouse name: Not on file  . Number of children: 5  . Years of education: Not on file  . Highest education level: Not on file  Occupational History    Employer: [employer]  . Financial resource strain: Not on file  . Food insecurity    Worry: Not on file    Inability: Not on file  . Transportation needs    Medical: Not on file    Non-medical: Not on file  Tobacco Use  . Smoking status: Never Smoker  . Smokeless tobacco: Never Used  Substance and Sexual Activity  . Alcohol use: Yes    Alcohol/week: 7.0 standard drinks    Types: 7 Glasses of wine per week    Comment: occ  . Drug use: No  . Sexual activity: Yes    Birth control/protection: Surgical  Lifestyle  . Physical activity    Days per week: Not on file    Minutes per session: Not on file  . Stress: Not on file  Relationships  . Social Herbalist on phone: Not on file    Gets together: Not on file    Attends  religious service: Not on file    Active member of club or organization: Not on file    Attends meetings of clubs or organizations: Not on file    Relationship status: Not on file  . Intimate partner violence    Fear of current or ex partner: Not on file    Emotionally abused: Not on file    Physically abused: Not on file    Forced sexual activity: Not on file  Other Topics Concern  . Not on file  Social History Narrative   Marital status: married      Children: five children.       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      Exercise: none   ROS  Review of Systems  Constitution: Negative for chills, decreased appetite, malaise/fatigue and weight gain.  Cardiovascular: Positive for dyspnea on exertion. Negative for leg swelling and syncope.  Endocrine: Negative for cold intolerance.  Hematologic/Lymphatic: Does not bruise/bleed easily.  Musculoskeletal: Negative for joint swelling.  Gastrointestinal: Negative for abdominal pain, anorexia, change in bowel habit, hematochezia and melena.  Neurological: Negative for headaches and light-headedness.  Psychiatric/Behavioral: Negative for depression and substance abuse.  All other systems reviewed and are negative.  Objective   Vitals with BMI 04/27/2019 03/18/2019 01/10/2017  Height 5' 4.5" 5' 4.5" -  Weight 218 lbs 218 lbs 14 oz -  BMI 87.68 11.57 -  Systolic 262 035 597  Diastolic 69 78 82  Pulse 85 76 67    Blood pressure 123/69, pulse 85, temperature (!) 97.4 F (36.3 C), height 5' 4.5" (1.638 m), weight 218 lb (98.9 kg), SpO2 97 %. Body mass index is 36.84 kg/m.   Physical Exam  Constitutional:  Moderately built and moderately obese in no acute distress.  HENT:  Head: Atraumatic.  Eyes: Conjunctivae are normal.  Neck: Neck supple. No JVD present. No thyromegaly present.  Cardiovascular: Normal rate, regular rhythm, S1 normal, S2 normal, intact distal pulses  and normal pulses. Exam reveals no gallop.  Murmur heard.  Early systolic murmur is present with a grade of 1/6 at the upper right sternal border and apex. Pulmonary/Chest: Effort normal and breath sounds normal.  Abdominal: Soft. Bowel sounds are normal.  Musculoskeletal: Normal range of motion.        General: No edema.  Neurological: She is alert.  Skin: Skin is warm and dry.  Psychiatric: She has a normal mood and affect.   Radiology: No results found.  Laboratory examination:   Recent Labs    02/08/19 0852 04/15/19 0939  NA 140 141  K 4.7 4.4  CL 103 102  CO2 24 27  GLUCOSE 131* 149*  BUN 10 11  CREATININE 0.84 0.91  CALCIUM 9.2 9.5  GFRNONAA 75 68  GFRAA 86 78   CMP Latest Ref Rng & Units 04/15/2019 02/08/2019 01/10/2017  Glucose 65 - 99 mg/dL 149(H) 131(H) 93  BUN 8 - 27 mg/dL '11 10 13  ' Creatinine 0.57 - 1.00 mg/dL 0.91 0.84 0.91  Sodium 134 - 144 mmol/L 141 140 137  Potassium 3.5 - 5.2 mmol/L 4.4 4.7 3.7  Chloride 96 - 106 mmol/L 102 103 101  CO2 20 - 29 mmol/L '27 24 27  ' Calcium 8.7 - 10.3 mg/dL 9.5 9.2 9.0  Total Protein 6.0 - 8.5 g/dL - 6.8 -  Total Bilirubin 0.0 - 1.2 mg/dL - 0.5 -  Alkaline Phos 39 - 117 IU/L - 61 -  AST 0 - 40 IU/L - 24 -  ALT 0 - 32 IU/L - 17 -   CBC Latest Ref Rng & Units 01/10/2017 01/10/2017 01/10/2017  WBC 4.0 - 10.5 K/uL 4.1 4.5 4.5  Hemoglobin 12.0 - 15.0 g/dL 13.0 12.9 12.9  Hematocrit 36.0 - 46.0 % 38.9 39.0 39.0  Platelets 150 - 400 K/uL 112(L) 150 150   Lipid Panel     Component Value Date/Time   CHOL 130 02/08/2019 0852   TRIG 82 02/08/2019 0852   HDL 67 02/08/2019 0852   CHOLHDL 3.0 08/11/2013 0950   VLDL 12 08/11/2013 0950   LDLCALC 47 02/08/2019 0852   HEMOGLOBIN A1C Lab Results  Component Value Date   HGBA1C 6.7 (H) 02/08/2019   TSH Recent Labs    02/08/19 0852  TSH 3.480   Medications and allergies   Allergies  Allergen Reactions  . Bactrim Other (See Comments)    Light headed, passing out      Prior to Admission medications   Medication Sig Start Date End Date Taking? Authorizing Provider  ALOE VERA PO Take 8 oz by mouth daily.   Yes [provider]  aspirin 81 MG chewable tablet Chew 1 tablet (81 mg total) by mouth daily. Patient taking differently: Chew 162 mg by mouth daily.  08/12/13  Yes Adrian Prows, MD  atorvastatin (LIPITOR) 40 MG tablet Take 1 tablet (40 mg total) by mouth daily. 12/20/18  Yes Adrian Prows, MD  Coenzyme Q10 (Q-10 CO-ENZYME PO) Take 1 packet by mouth daily.   Yes [provider]  hydrocortisone (ANUSOL-HC) 25 MG suppository Place 1 suppository (25 mg total) rectally 2 (two) times daily as needed for hemorrhoids. 12/19/15  Yes Shawnee Knapp, MD  Iron-Vitamins (VITALIZE PO) Take 2 capsules by mouth daily at 2 PM.   Yes [provider]  metoprolol succinate (TOPROL-XL) 50 MG 24 hr tablet TAKE 1 TABLET(50 MG) BY MOUTH DAILY 02/07/19  Yes Adrian Prows, MD  Multiple Vitamin (MULTIVITAMIN WITH MINERALS) TABS tablet Take 2 tablets by mouth daily.   Yes [provider]  nitroGLYCERIN (NITROSTAT) 0.4 MG SL tablet Place 0.4 mg under the tongue every 5 (five) minutes as needed for chest pain.   Yes [provider]  Omega-3 Fatty Acids (OMEGA 3 PO) Take 2 capsules by mouth daily with breakfast.   Yes [provider]     Current Outpatient Medications  Medication Instructions  . ALOE VERA PO 8 oz, Daily  . aspirin 81 mg, Oral, Daily  . atorvastatin (LIPITOR) 40 mg, Oral, Daily  . Coenzyme Q10 (Q-10 CO-ENZYME PO) 1 packet, Daily  . hydrocortisone (ANUSOL-HC) 25 mg, Rectal, 2 times daily PRN  . Iron-Vitamins (VITALIZE PO) 2 capsules, Oral, Daily  . losartan-hydrochlorothiazide (HYZAAR) 50-12.5 MG tablet 1 tablet, Oral, BH-each morning  .  metoprolol succinate (TOPROL-XL) 50 mg, Oral, Daily, Take with or immediately following a meal.  . Multiple Vitamin (MULTIVITAMIN WITH MINERALS) TABS tablet 2 tablets, Daily  . nitroGLYCERIN  (NITROSTAT) 0.4 mg, Every 5 min PRN  . Omega-3 Fatty Acids (OMEGA 3 PO) 2 capsules, Daily with breakfast  . OVER THE COUNTER MEDICATION Forever focus   . OVER THE COUNTER MEDICATION Forever move     Cardiac Studies:   Exercise sestamibi stress test 08/01/2013: 1. Resting EKG showed normal sinus rhythm, poor R wave progression, Stress EKG was positive for ischemia. Patient had frequent PVCs, ventricular bigeminy at peak exercise. Patient had 2 mm horizontal ST segment depression 3 minutes into recovery that persisted for 5.5 minutes into recovery. Patient exercised on BRUCE PROTOCOL for 5 minutes 58 seconds. The maximum work level achieved was 7.3 MET's. The test was terminated due to achievement of the target heart rate. 2. Perfusion imaging study demonstrates a moderate to large size severe septal ischemia extending from the base to the apex and includes apical anterior and apex. This reached statistical significance. Dynamic QGS images reveal septal hypokinesis and apical hypokinesis. Left ventricular ejection fraction was estimated to be 52%. This represents a high risk study, consider further cardiac workup including coronary angiography if clinically indicated.  Cardiac cath 02/28/2014: Distal RCA 2.75x52m Xience DES. EF 55%. 08/11/2013: Stenting ostial and proximal and mid LAD with overlapping 3.0x28, 3.0x12 Promus premier DES. Large Ramus intermediate with proximal 40% stenosis.  Echocardiogram 06/05/2016: Left ventricle cavity is normal in size. Normal global wall motion. Doppler evidence of grade I (impaired) diastolic dysfunction, normal LAP. Calculated EF 55%. Mild (Grade I) mitral regurgitation. Mild tricuspid regurgitation. No evidence of pulmonary hypertension. Mild pulmonic regurgitation  Assessment     ICD-10-CM   1. Coronary artery disease involving native coronary artery of native heart without angina pectoris  I25.10   2. Hypercholesteremia  E78.00   3. Essential  hypertension  I10   4. Controlled type 2 diabetes mellitus without complication, without long-term current use of insulin ([place]  E11.9     EKG 03/18/2019: Normal sinus rhythm at rate of 72 bpm, normal axis.  Nonspecific T-wave flattening in 1 and aVL.  Normal QT interval.    Recommendations:   MLakyla Biswas is a 63y.o.  AAF with known coronary artery disease with history of proximal and mid LAD stent placed on 08/11/2013 and distal RCA stent placed on 02/28/2014, presenting with exertional chest pain the 1st time and syncope the 2nd time. She has hyperlipidemia, family history of premature coronary artery disease, hyperglycemia, obesity and mild chronic thrombocytopenia.  I seen her 6 weeks ago, increased her statins and also started on valsartan HCT, she now presents for follow-up.  Blood pressure is now well controlled, I reviewed her labs, lipids are also under excellent control.  Previously she is prediabetic now A1c isn't diabetic range.  She will contact and discuss with her PCP regarding starting therapy for the same.  Weight loss was again discussed.  She maintains sinus rhythm, she has not had any recurrence of angina pectoris, I'll see her back in one year. Labs faxed to her PCP.   JAdrian Prows MD, [facility]11/04/2019, 11:03 PM PAlpha[facility]iovascular. PFountain HillPager: [phone] Office: [phone] no answer Cell [phone]

## 2019-07-06 ENCOUNTER — Other Ambulatory Visit: Payer: Self-pay | Admitting: Cardiology

## 2019-07-14 ENCOUNTER — Other Ambulatory Visit: Payer: Self-pay | Admitting: Cardiology

## 2019-07-14 DIAGNOSIS — I10 Essential (primary) hypertension: Secondary | ICD-10-CM

## 2020-01-07 ENCOUNTER — Other Ambulatory Visit: Payer: Self-pay | Admitting: Cardiology

## 2020-01-07 DIAGNOSIS — Z9861 Coronary angioplasty status: Secondary | ICD-10-CM

## 2020-04-26 ENCOUNTER — Encounter: Payer: Self-pay | Admitting: Cardiology

## 2020-04-26 ENCOUNTER — Other Ambulatory Visit: Payer: Self-pay

## 2020-04-26 ENCOUNTER — Ambulatory Visit: Payer: BC Managed Care – PPO | Admitting: Cardiology

## 2020-04-26 VITALS — BP 117/72 | HR 83 | Resp 16 | Ht 64.0 in | Wt 216.0 lb

## 2020-04-26 DIAGNOSIS — I10 Essential (primary) hypertension: Secondary | ICD-10-CM

## 2020-04-26 DIAGNOSIS — E7800 Pure hypercholesterolemia, unspecified: Secondary | ICD-10-CM

## 2020-04-26 DIAGNOSIS — I2510 Atherosclerotic heart disease of native coronary artery without angina pectoris: Secondary | ICD-10-CM

## 2020-04-26 NOTE — Progress Notes (Signed)
Primary Physician/Referring:  Donald Prose, MD  Patient ID: Emma Combs, female    DOB: [birth year], 64 y.o.   MRN: [id number]  Chief Complaint  Patient presents with  . Coronary Artery Disease  . Hypertension  . Hyperlipidemia  . Follow-up    1 year    HPI:    Emma Combs  is a 64 y.o.  AAF with known coronary artery disease with history of proximal and mid LAD stent placed on 08/11/2013 and distal RCA stent placed on 02/28/2014, presenting with exertional chest pain the 1st time and syncope the 2nd time. She has hyperlipidemia, family history of premature coronary artery disease, hyperglycemia, obesity and mild chronic thrombocytopenia. She presents here for annual follow-up. Remains asymptomatic except for chronic mild exertional dyspnea. No change in weight.    Past Medical History:  Diagnosis Date  . Anginal pain ([place])   . Arthritis    "joints ache; hips" (02/28/2014)  . Borderline diabetes mellitus   . Coronary atherosclerosis of native coronary artery    08/12/2013: PTCA and stenting of the proximal and mid LAD with overlapping 3.0 x 28 mm in the proximal to mid segment and the ostial 3.0 x 12 mm Promus Premier drug-eluting stent. 02/28/2014: Stent distal RCA 2.75x15 Xience Alpine DES. Mod disease in Ramus intermediate. EF 55%  . Glucose intolerance (impaired glucose tolerance)   . High cholesterol   . Obesity   . Uterine fibroid    Past Surgical History:  Procedure Laterality Date  . CORONARY ANGIOPLASTY WITH STENT PLACEMENT  08/11/2013; 02/28/2014   "2 + 1"  . CORONARY STENT PLACEMENT  08/11/2013  . HEMORRHOID BANDING  2009  . LEFT AND RIGHT HEART CATHETERIZATION WITH CORONARY ANGIOGRAM N/A 08/11/2013   Procedure: LEFT AND RIGHT HEART CATHETERIZATION WITH CORONARY ANGIOGRAM;  Surgeon: Laverda Page, MD;  Location: [facility] CATH LAB;  Service: Cardiovascular;  Laterality: N/A;  . LEFT HEART CATHETERIZATION WITH CORONARY ANGIOGRAM N/A 02/28/2014   Procedure: LEFT HEART  CATHETERIZATION WITH CORONARY ANGIOGRAM;  Surgeon: Laverda Page, MD;  Location: [facility] CATH LAB;  Service: Cardiovascular;  Laterality: N/A;  . PERCUTANEOUS CORONARY STENT INTERVENTION (PCI-S)  02/28/2014   Procedure: PERCUTANEOUS CORONARY STENT INTERVENTION (PCI-S);  Surgeon: Laverda Page, MD;  Location: [facility] CATH LAB;  Service: Cardiovascular;;  2.75 x 54m Xience DEstent distal RCA  . TEE WITHOUT CARDIOVERSION N/A 09/06/2013   Procedure: TRANSESOPHAGEAL ECHOCARDIOGRAM (TEE);  Surgeon: JLaverda Page MD;  [place]  Service: Cardiovascular;  Laterality: N/A;  . TUBAL LIGATION  1992   Social History   Tobacco Use  . Smoking status: Never Smoker  . Smokeless tobacco: Never Used  Substance Use Topics  . Alcohol use: Yes    Alcohol/week: 7.0 standard drinks    Types: 7 Glasses of wine per week    Comment: occ  Marital Status: Married    ROS  Review of Systems  Cardiovascular: Negative for chest pain, dyspnea on exertion and leg swelling.  Gastrointestinal: Negative for melena.   Objective   Vitals with BMI 04/26/2020 04/27/2019 03/18/2019  Height _0  5' 4.5" 5' 4.5"  Weight 216 lbs 218 lbs 218 lbs 14 oz  BMI 37.06 [phone] Systolic [id number] Diastolic 72 69 78  Pulse 83 85 76    Blood pressure 117/72, pulse 83, resp. rate 16, height _1  (1.626 m), weight 216 lb (98 kg), SpO2 99 %. Body mass index is 37.08 kg/m.   Physical Exam Constitutional:  Comments: Moderately built and moderately obese in no acute distress.  Neck:     Thyroid: No thyromegaly.     Vascular: No JVD.  Cardiovascular:     Rate and Rhythm: Normal rate and regular rhythm.     Pulses: Normal pulses and intact distal pulses.     Heart sounds: S1 normal and S2 normal. Murmur heard.  Early systolic murmur is present with a grade of 1/6 at the upper right sternal border and apex.  No gallop.   Pulmonary:     Effort: Pulmonary effort is normal.     Breath sounds: Normal  breath sounds.  Abdominal:     General: Bowel sounds are normal.     Palpations: Abdomen is soft.  Musculoskeletal:        General: Normal range of motion.     Cervical back: Neck supple.  Skin:    General: Skin is warm and dry.  Neurological:     Mental Status: She is alert.    Radiology: No results found.  Laboratory examination:   No results for input(s): NA, K, CL, CO2, GLUCOSE, BUN, CREATININE, CALCIUM, GFRNONAA, GFRAA in the last 8760 hours. CMP Latest Ref Rng & Units 04/15/2019 02/08/2019 01/10/2017  Glucose 65 - 99 mg/dL 149(H) 131(H) 93  BUN 8 - 27 mg/dL _0 Creatinine 0.57 - 1.00 mg/dL 0.91 0.84 0.91  Sodium 134 - 144 mmol/L 141 140 137  Potassium 3.5 - 5.2 mmol/L 4.4 4.7 3.7  Chloride 96 - 106 mmol/L 102 103 101  CO2 20 - 29 mmol/L _1 Calcium 8.7 - 10.3 mg/dL 9.5 9.2 9.0  Total Protein 6.0 - 8.5 g/dL - 6.8 -  Total Bilirubin 0.0 - 1.2 mg/dL - 0.5 -  Alkaline Phos 39 - 117 IU/L - 61 -  AST 0 - 40 IU/L - 24 -  ALT 0 - 32 IU/L - 17 -   CBC Latest Ref Rng & Units 01/10/2017 01/10/2017 01/10/2017  WBC 4.0 - 10.5 K/uL 4.1 4.5 4.5  Hemoglobin 12.0 - 15.0 g/dL 13.0 12.9 12.9  Hematocrit 36 - 46 % 38.9 39.0 39.0  Platelets 150 - 400 K/uL 112(L) 150 150    Lipid Panel     Component Value Date/Time   CHOL 130 02/08/2019 0852   TRIG 82 02/08/2019 0852   HDL 67 02/08/2019 0852   CHOLHDL 3.0 08/11/2013 0950   VLDL 12 08/11/2013 0950   LDLCALC 47 02/08/2019 0852   HEMOGLOBIN A1C Lab Results  Component Value Date   HGBA1C 6.7 (H) 02/08/2019    External labs:  Cholesterol, total 130.000 02/09/2019 HDL 67.000 02/09/2019 LDL-C 47.000 02/09/2019 Triglycerides 82.000 02/09/2019  A1C 6.700 mg/ 03/06/2020 TSH 2.650 01/26/2020  Hemoglobin 13.000 g/d 01/26/2020  Creatinine, Serum 0.870 mg/ 01/26/2020 Potassium 4.200 mm 01/26/2020 ALT (SGPT) 17.000 IU/ 02/08/2019  Medications and allergies   Allergies  Allergen Reactions  . Bactrim Other (See Comments)     Light headed, passing out    Current Outpatient Medications on File Prior to Visit  Medication Sig Dispense Refill  . ALOE VERA PO Take 8 oz by mouth daily.    Marland Kitchen ascorbic acid (VITAMIN C) 500 MG tablet Take 500 mg by mouth daily.    Marland Kitchen aspirin 81 MG chewable tablet Chew 1 tablet (81 mg total) by mouth daily. (Patient taking differently: Chew 162 mg by mouth daily. )    . atorvastatin (LIPITOR) 40 MG tablet TAKE 1 TABLET(40 MG) BY MOUTH  DAILY 90 tablet 1  . Coenzyme Q10 (Q-10 CO-ENZYME PO) Take 1 packet by mouth daily.    . hydrocortisone (ANUSOL-HC) 25 MG suppository Place 1 suppository (25 mg total) rectally 2 (two) times daily as needed for hemorrhoids. 24 suppository 0  . Iron-Vitamins (VITALIZE PO) Take 2 capsules by mouth daily at 2 PM.    . losartan-hydrochlorothiazide (HYZAAR) 50-12.5 MG tablet TAKE 1 TABLET BY MOUTH EVERY MORNING 30 tablet 11  . metFORMIN (GLUCOPHAGE-XR) 500 MG 24 hr tablet Take 1,000 mg by mouth daily.    . metoprolol succinate (TOPROL-XL) 50 MG 24 hr tablet TAKE 1 TABLET BY MOUTH DAILY WITH OR IMMEDIATELY FOLLOWING A MEAL 90 tablet 2  . Multiple Vitamin (MULTIVITAMIN WITH MINERALS) TABS tablet Take 2 tablets by mouth daily.    . nitroGLYCERIN (NITROSTAT) 0.4 MG SL tablet Place 0.4 mg under the tongue every 5 (five) minutes as needed for chest pain.    . Omega-3 Fatty Acids (OMEGA 3 PO) Take 2 capsules by mouth daily with breakfast.    . OVER THE COUNTER MEDICATION Forever focus    . OVER THE COUNTER MEDICATION Forever move    . Vitamin D, Ergocalciferol, (DRISDOL) 1.25 MG (50000 UNIT) CAPS capsule Take 50,000 Units by mouth once a week.     No current facility-administered medications on file prior to visit.     Cardiac Studies:   Exercise sestamibi stress test 08/01/2013: 1. Resting EKG showed normal sinus rhythm, poor R wave progression, Stress EKG was positive for ischemia. Patient had frequent PVCs, ventricular bigeminy at peak exercise. Patient had 2 mm  horizontal ST segment depression 3 minutes into recovery that persisted for 5.5 minutes into recovery. Patient exercised on BRUCE PROTOCOL for 5 minutes 58 seconds. The maximum work level achieved was 7.3 MET's. The test was terminated due to achievement of the target heart rate. 2. Perfusion imaging study demonstrates a moderate to large size severe septal ischemia extending from the base to the apex and includes apical anterior and apex. This reached statistical significance. Dynamic QGS images reveal septal hypokinesis and apical hypokinesis. Left ventricular ejection fraction was estimated to be 52%. This represents a high risk study, consider further cardiac workup including coronary angiography if clinically indicated.  Cardiac cath 02/28/2014: Distal RCA 2.75x55m Xience DES. EF 55%. 08/11/2013: Stenting ostial and proximal and mid LAD with overlapping 3.0x28, 3.0x12 Promus premier DES. Large Ramus intermediate with proximal 40% stenosis.  Echocardiogram 06/05/2016: Left ventricle cavity is normal in size. Normal global wall motion. Doppler evidence of grade I (impaired) diastolic dysfunction, normal LAP. Calculated EF 55%. Mild (Grade I) mitral regurgitation. Mild tricuspid regurgitation. No evidence of pulmonary hypertension. Mild pulmonic regurgitation.  EKG:  EKG 04/26/2020: Normal sinus rhythm at rate of 73 beats found, normal axis.  Incomplete right bundle branch block.  Poor R wave progression, probably normal variant.  Nonspecific T abnormality.  No significant change from 03/18/2019.  Assessment     ICD-10-CM   1. Coronary artery disease involving native coronary artery of native heart without angina pectoris  I25.10 EKG 12-Lead  2. Essential hypertension  I10   3. Hypercholesteremia  E78.00     No orders of the defined types were placed in this encounter. There are no discontinued medications.   Recommendations:   Emma Combs is a 64y.o.  AAF with known coronary  artery disease with history of proximal and mid LAD stent placed on 08/11/2013 and distal RCA stent placed on 02/28/2014, presenting with  exertional chest pain the 1st time and syncope the 2nd time. She has hyperlipidemia, family history of premature coronary artery disease, hyperglycemia, obesity and mild chronic thrombocytopenia.  Blood pressure is now well controlled, I reviewed her labs, lipids are also under excellent control.  Previously she is prediabetic now A1c isn't diabetic range.  She will contact and discuss with her PCP regarding starting therapy for the same.  Weight loss was again discussed.  She maintains sinus rhythm, she has not had any recurrence of angina pectoris, I'll see her back in one year   Adrian Prows, MD, [facility] 04/26/2020, 9:48 AM Office: [phone] Pager: [phone]

## 2020-07-13 ENCOUNTER — Other Ambulatory Visit: Payer: Self-pay | Admitting: Cardiology

## 2020-07-13 DIAGNOSIS — I10 Essential (primary) hypertension: Secondary | ICD-10-CM

## 2020-07-15 ENCOUNTER — Other Ambulatory Visit: Payer: Self-pay | Admitting: Cardiology

## 2020-10-13 ENCOUNTER — Other Ambulatory Visit: Payer: Self-pay | Admitting: Student

## 2020-10-13 ENCOUNTER — Other Ambulatory Visit: Payer: Self-pay | Admitting: Cardiology

## 2020-10-13 DIAGNOSIS — Z9861 Coronary angioplasty status: Secondary | ICD-10-CM

## 2020-10-13 MED ORDER — METOPROLOL SUCCINATE ER 50 MG PO TB24: ORAL_TABLET | ORAL | 2 refills | Status: DC

## 2021-01-14 ENCOUNTER — Other Ambulatory Visit: Payer: Self-pay | Admitting: Cardiology

## 2021-02-19 HISTORY — PX: COLONOSCOPY W/ BIOPSIES: SHX1374

## 2021-04-13 ENCOUNTER — Other Ambulatory Visit: Payer: Self-pay | Admitting: Cardiology

## 2021-04-13 ENCOUNTER — Other Ambulatory Visit: Payer: Self-pay | Admitting: Student

## 2021-04-13 DIAGNOSIS — Z9861 Coronary angioplasty status: Secondary | ICD-10-CM

## 2021-04-13 DIAGNOSIS — I10 Essential (primary) hypertension: Secondary | ICD-10-CM

## 2021-04-26 ENCOUNTER — Ambulatory Visit: Payer: Self-pay | Admitting: Student

## 2021-04-26 ENCOUNTER — Encounter: Payer: Self-pay | Admitting: Student

## 2021-04-26 ENCOUNTER — Ambulatory Visit: Payer: BC Managed Care – PPO | Admitting: Cardiology

## 2021-04-26 ENCOUNTER — Other Ambulatory Visit: Payer: Self-pay

## 2021-04-26 VITALS — BP 130/77 | HR 78 | Temp 97.9°F | Ht 64.0 in | Wt 204.0 lb

## 2021-04-26 DIAGNOSIS — I10 Essential (primary) hypertension: Secondary | ICD-10-CM

## 2021-04-26 DIAGNOSIS — E7800 Pure hypercholesterolemia, unspecified: Secondary | ICD-10-CM

## 2021-04-26 DIAGNOSIS — I2510 Atherosclerotic heart disease of native coronary artery without angina pectoris: Secondary | ICD-10-CM

## 2021-04-26 NOTE — Progress Notes (Signed)
Primary Physician/Referring:  Donald Prose, MD  Patient ID: Emma Combs, female    DOB: Mar 13, 1956, 65 y.o.   MRN: [id number]  Chief Complaint  Patient presents with   Hypertension   Coronary Artery Disease   HPI:    Emma Combs  is a 65 y.o.  AAF with known coronary artery disease with history of proximal and mid LAD stent placed on 08/11/2013 and distal RCA stent placed on 02/28/2014.  She has history of hyperlipidemia, family history of premature CAD, hyperglycemia, obesity, mild chronic thrombocytopenia.   Patient presents for annual follow-up.  Last office visit patient was stable and no changes were made.  Patient's dyspnea on exertion has improved over the last 1 year.  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  Denies chest pain, palpitations, syncope, near syncope.  Denies orthopnea, PND, leg swelling.  Past Medical History:  Diagnosis Date   Anginal pain ([place])    Arthritis    "joints ache; hips" (02/28/2014)   Borderline diabetes mellitus    Coronary atherosclerosis of native coronary artery    08/12/2013: PTCA and stenting of the proximal and mid LAD with overlapping 3.0 x 28 mm in the proximal to mid segment and the ostial 3.0 x 12 mm Promus Premier drug-eluting stent. 02/28/2014: Stent distal RCA 2.75x15 Xience Alpine DES. Mod disease in Ramus intermediate. EF 55%   Glucose intolerance (impaired glucose tolerance)    High cholesterol    Obesity    Uterine fibroid    Past Surgical History:  Procedure Laterality Date   COLONOSCOPY W/ BIOPSIES  02/19/2021   CORONARY ANGIOPLASTY WITH STENT PLACEMENT  08/11/2013; 02/28/2014   "2 + 1"   CORONARY STENT PLACEMENT  08/11/2013   HEMORRHOID BANDING  2009   LEFT AND RIGHT HEART CATHETERIZATION WITH CORONARY ANGIOGRAM N/A 08/11/2013   Procedure: LEFT AND RIGHT HEART CATHETERIZATION WITH CORONARY ANGIOGRAM;  Surgeon: Laverda Page, MD;  Location: [facility] CATH LAB;  Service:  Cardiovascular;  Laterality: N/A;   LEFT HEART CATHETERIZATION WITH CORONARY ANGIOGRAM N/A 02/28/2014   Procedure: LEFT HEART CATHETERIZATION WITH CORONARY ANGIOGRAM;  Surgeon: Laverda Page, MD;  Location: [facility] CATH LAB;  Service: Cardiovascular;  Laterality: N/A;   PERCUTANEOUS CORONARY STENT INTERVENTION (PCI-S)  02/28/2014   Procedure: PERCUTANEOUS CORONARY STENT INTERVENTION (PCI-S);  Surgeon: Laverda Page, MD;  Location: [facility] CATH LAB;  Service: Cardiovascular;;  2.75 x 41m Xience DEstent distal RCA   TEE WITHOUT CARDIOVERSION N/A 09/06/2013   Procedure: TRANSESOPHAGEAL ECHOCARDIOGRAM (TEE);  Surgeon: JLaverda Page MD;  [facility] [facility]  Service: Cardiovascular;  Laterality: N/A;   TUBAL LIGATION  1992   Family History  Problem Relation Age of Onset   Hyperlipidemia Mother    Cancer Mother        leukemia   Diabetes Mother    Diabetes Father    Hyperlipidemia Father    Stroke Father    Cancer Sister    Diabetes Sister    Hyperlipidemia Sister    Hypertension Sister    Hypertension Brother    Hyperlipidemia Brother    Diabetes Brother    Social History   Tobacco Use   Smoking status: Never   Smokeless tobacco: Never  Substance Use Topics   Alcohol use: Yes    Alcohol/week: 7.0 standard drinks    Types: 7 Glasses of wine per week    Comment: occ  Marital Status: Married    ROS  Review of Systems  Constitutional: Negative for malaise/fatigue and weight gain.  Cardiovascular:  Negative for chest pain, claudication, dyspnea on exertion, leg swelling, near-syncope, orthopnea, palpitations, paroxysmal nocturnal dyspnea and syncope.  Respiratory:  Negative for shortness of breath.   Gastrointestinal:  Negative for melena.  Neurological:  Negative for dizziness.  Objective   Vitals with BMI 04/26/2021 04/26/2020 04/27/2019  Height _0  _1  5' 4.5"  Weight 204 lbs 216 lbs 218 lbs  BMI 35 01.60 10.93  Systolic 235 573 220  Diastolic 77 72 69  Pulse  78 83 85    Blood pressure 130/77, pulse 78, temperature 97.9 F (36.6 C), height _2  (1.626 m), weight 204 lb (92.5 kg), SpO2 98 %. Body mass index is 35.02 kg/m.   Physical Exam Vitals reviewed.  Constitutional:      Comments: Moderately built and moderately obese in no acute distress.  Neck:     Thyroid: No thyromegaly.     Vascular: No carotid bruit or JVD.  Cardiovascular:     Rate and Rhythm: Normal rate and regular rhythm.     Pulses: Normal pulses and intact distal pulses.     Heart sounds: S1 normal and S2 normal. Murmur heard.  Early systolic murmur is present with a grade of 1/6 at the upper right sternal border and apex.    No gallop.  Pulmonary:     Effort: Pulmonary effort is normal. No respiratory distress.     Breath sounds: Normal breath sounds. No wheezing.  Musculoskeletal:     Right lower leg: No edema.     Left lower leg: No edema.  Neurological:     Mental Status: She is alert.   Laboratory examination:   No results for input(s): NA, K, CL, CO2, GLUCOSE, BUN, CREATININE, CALCIUM, GFRNONAA, GFRAA in the last 8760 hours. CMP Latest Ref Rng & Units 04/15/2019 02/08/2019 01/10/2017  Glucose 65 - 99 mg/dL 149(H) 131(H) 93  BUN 8 - 27 mg/dL _3 Creatinine 0.57 - 1.00 mg/dL 0.91 0.84 0.91  Sodium 134 - 144 mmol/L 141 140 137  Potassium 3.5 - 5.2 mmol/L 4.4 4.7 3.7  Chloride 96 - 106 mmol/L 102 103 101  CO2 20 - 29 mmol/L _4 Calcium 8.7 - 10.3 mg/dL 9.5 9.2 9.0  Total Protein 6.0 - 8.5 g/dL - 6.8 -  Total Bilirubin 0.0 - 1.2 mg/dL - 0.5 -  Alkaline Phos 39 - 117 IU/L - 61 -  AST 0 - 40 IU/L - 24 -  ALT 0 - 32 IU/L - 17 -   CBC Latest Ref Rng & Units 01/10/2017 01/10/2017 01/10/2017  WBC 4.0 - 10.5 K/uL 4.1 4.5 4.5  Hemoglobin 12.0 - 15.0 g/dL 13.0 12.9 12.9  Hematocrit 36.0 - 46.0 % 38.9 39.0 39.0  Platelets 150 - 400 K/uL 112(L) 150 150    Lipid Panel     Component Value Date/Time   CHOL 130 02/08/2019 0852   TRIG 82 02/08/2019 0852    HDL 67 02/08/2019 0852   CHOLHDL 3.0 08/11/2013 0950   VLDL 12 08/11/2013 0950   LDLCALC 47 02/08/2019 0852   HEMOGLOBIN A1C Lab Results  Component Value Date   HGBA1C 6.7 (H) 02/08/2019    External labs: 11/30/2020: A1c 6.4% BUN 13, creatinine 0.5, GFR >60  Cholesterol, total 130.000 02/09/2019 HDL 67.000 02/09/2019 LDL-C 47.000 02/09/2019 Triglycerides 82.000 02/09/2019  A1C 6.700 mg/ 03/06/2020 TSH 2.650 01/26/2020  Hemoglobin 13.000 g/d 01/26/2020  Creatinine, Serum 0.870 mg/ 01/26/2020 Potassium 4.200 mm 01/26/2020 ALT (SGPT) 17.000 IU/ 02/08/2019  Allergies   Allergies  Allergen Reactions   Bactrim Other (See Comments)    Light headed, passing out    Medications Prior to Visit:   Outpatient Medications Prior to Visit  Medication Sig Dispense Refill   ALOE VERA PO Take 8 oz by mouth daily.     ascorbic acid (VITAMIN C) 500 MG tablet Take 500 mg by mouth daily.     aspirin 81 MG chewable tablet Chew 1 tablet (81 mg total) by mouth daily. (Patient taking differently: Chew 162 mg by mouth daily.)     atorvastatin (LIPITOR) 40 MG tablet TAKE 1 TABLET(40 MG) BY MOUTH DAILY 90 tablet 1   Coenzyme Q10 (Q-10 CO-ENZYME PO) Take 1 packet by mouth daily.     hydrocortisone (ANUSOL-HC) 25 MG suppository Place 1 suppository (25 mg total) rectally 2 (two) times daily as needed for hemorrhoids. 24 suppository 0   losartan-hydrochlorothiazide (HYZAAR) 50-12.5 MG tablet TAKE 1 TABLET BY MOUTH EVERY MORNING 30 tablet 11   metFORMIN (GLUCOPHAGE-XR) 500 MG 24 hr tablet Take 1,000 mg by mouth daily.     metoprolol succinate (TOPROL-XL) 50 MG 24 hr tablet TAKE 1 TABLET BY MOUTH DAILY WITH OR IMMEDIATELY FOLLOWING A MEAL 90 tablet 2   Multiple Vitamin (MULTIVITAMIN WITH MINERALS) TABS tablet Take 2 tablets by mouth daily.     nitroGLYCERIN (NITROSTAT) 0.4 MG SL tablet Place 0.4 mg under the tongue every 5 (five) minutes as needed for chest pain.     Omega-3 Fatty Acids (OMEGA 3 PO) Take 2  capsules by mouth daily with breakfast.     OVER THE COUNTER MEDICATION Forever focus     OVER THE COUNTER MEDICATION Forever move     Vitamin D, Ergocalciferol, (DRISDOL) 1.25 MG (50000 UNIT) CAPS capsule Take 50,000 Units by mouth once a week.     Iron-Vitamins (VITALIZE PO) Take 2 capsules by mouth daily at 2 PM.     No facility-administered medications prior to visit.   Final Medications at End of Visit    Current Meds  Medication Sig   ALOE VERA PO Take 8 oz by mouth daily.   ascorbic acid (VITAMIN C) 500 MG tablet Take 500 mg by mouth daily.   aspirin 81 MG chewable tablet Chew 1 tablet (81 mg total) by mouth daily. (Patient taking differently: Chew 162 mg by mouth daily.)   atorvastatin (LIPITOR) 40 MG tablet TAKE 1 TABLET(40 MG) BY MOUTH DAILY   Coenzyme Q10 (Q-10 CO-ENZYME PO) Take 1 packet by mouth daily.   hydrocortisone (ANUSOL-HC) 25 MG suppository Place 1 suppository (25 mg total) rectally 2 (two) times daily as needed for hemorrhoids.   losartan-hydrochlorothiazide (HYZAAR) 50-12.5 MG tablet TAKE 1 TABLET BY MOUTH EVERY MORNING   metFORMIN (GLUCOPHAGE-XR) 500 MG 24 hr tablet Take 1,000 mg by mouth daily.   metoprolol succinate (TOPROL-XL) 50 MG 24 hr tablet TAKE 1 TABLET BY MOUTH DAILY WITH OR IMMEDIATELY FOLLOWING A MEAL   Multiple Vitamin (MULTIVITAMIN WITH MINERALS) TABS tablet Take 2 tablets by mouth daily.   nitroGLYCERIN (NITROSTAT) 0.4 MG SL tablet Place 0.4 mg under the tongue every 5 (five) minutes as needed for chest pain.   Omega-3 Fatty Acids (OMEGA 3 PO) Take 2 capsules by mouth daily with breakfast.   OVER THE COUNTER MEDICATION Forever focus   OVER THE COUNTER MEDICATION Forever move   Vitamin D, Ergocalciferol, (DRISDOL) 1.25 MG (50000  UNIT) CAPS capsule Take 50,000 Units by mouth once a week.   [DISCONTINUED] Iron-Vitamins (VITALIZE PO) Take 2 capsules by mouth daily at 2 PM.   Radiology  No results found.  Cardiac Studies:   Exercise sestamibi stress  test 08/01/2013: 1. Resting EKG showed normal sinus rhythm, poor R wave progression, Stress EKG was positive for ischemia.  Patient had frequent PVCs, ventricular bigeminy at peak exercise.  Patient had 2 mm horizontal ST segment depression 3 minutes into recovery that persisted for 5.5 minutes into recovery. Patient exercised on BRUCE PROTOCOL for 5 minutes 58 seconds. The maximum work level achieved was 7.3 MET's.  The test was terminated due to achievement of the target heart rate. 2. Perfusion imaging study demonstrates a moderate to large size severe septal ischemia extending from the base to the apex and includes apical anterior and apex.  This reached statistical significance.   Dynamic  QGS  images  reveal septal hypokinesis and apical hypokinesis. Left ventricular ejection fraction was estimated to be 52%.  This represents a high risk study, consider further cardiac workup including coronary angiography if clinically indicated.  Cardiac cath 02/28/2014: Distal RCA 2.75x9m Xience DES. EF 55%. 08/11/2013: Stenting ostial and proximal and mid LAD with overlapping 3.0x28, 3.0x12 Promus premier DES. Large Ramus intermediate with proximal 40% stenosis.  Echocardiogram 06/05/2016: Left ventricle cavity is normal in size. Normal global wall motion. Doppler evidence of grade I (impaired) diastolic dysfunction, normal LAP. Calculated EF 55%. Mild (Grade I) mitral regurgitation. Mild tricuspid regurgitation. No evidence of pulmonary hypertension. Mild pulmonic regurgitation.   EKG  04/26/2021: Sinus rhythm at a rate of 75 bpm.  Normal axis.   Nonspecific T-abnormality.  04/26/2020: Normal sinus rhythm at rate of 73 beats found, normal axis.  Incomplete right bundle branch block.  Poor R wave progression, probably normal variant.  Nonspecific T abnormality.  No significant change from 03/18/2019.  Assessment     ICD-10-CM   1. Coronary artery disease involving native coronary artery of native heart  without angina pectoris  I25.10 EKG 12-Lead    2. Essential hypertension  I10     3. Hypercholesteremia  E78.00 Lipid Panel With LDL/HDL Ratio      No orders of the defined types were placed in this encounter.  Medications Discontinued During This Encounter  Medication Reason   Iron-Vitamins (VITALIZE PO) Error     Recommendations:   MYissel Habermehl is a 65y.o. AAF with known coronary artery disease with history of proximal and mid LAD stent placed on 08/11/2013 and distal RCA stent placed on 02/28/2014.  She has history of hyperlipidemia, family history of premature CAD, hyperglycemia, obesity, mild chronic thrombocytopenia.   Patient presents for annual follow-up.  Last office visit patient was stable and no changes were made.  Patient remains asymptomatic from a cardiac standpoint.  In fact patient reports dyspnea on exertion has improved as she is slowly increased physical activity.  I personally reviewed external labs, no recent lipid profile testing for review.  Will obtain lipid panel at this time.  EKG and physical exam remained stable.  Will not make changes to medications at this time as blood pressure is well controlled.  Follow-up in 1 year, sooner if needed, for CAD, hyperlipidemia.   CAlethia Berthold PA-C 04/26/2021, 12:25 PM Office: [phone]

## 2021-05-08 ENCOUNTER — Ambulatory Visit: Payer: Self-pay | Admitting: Cardiology

## 2021-05-31 NOTE — Progress Notes (Signed)
External labs 05/29/2021: A1c 6.5% Creatinine 0.81, GFR >60, sodium 142, potassium 4.4, BUN 14, Total cholesterol 145, triglycerides 79, HDL 76, LDL 53  Lipids well controlled, renal function stable

## 2021-11-01 ENCOUNTER — Other Ambulatory Visit: Payer: Self-pay | Admitting: Cardiology

## 2021-12-13 ENCOUNTER — Emergency Department (HOSPITAL_COMMUNITY): Payer: BC Managed Care – PPO

## 2021-12-13 ENCOUNTER — Other Ambulatory Visit: Payer: Self-pay

## 2021-12-13 ENCOUNTER — Encounter (HOSPITAL_COMMUNITY): Payer: Self-pay

## 2021-12-13 ENCOUNTER — Emergency Department (HOSPITAL_COMMUNITY)
Admission: EM | Admit: 2021-12-13 | Discharge: 2021-12-13 | Disposition: A | Discharge status: Home / Self Care | Payer: BC Managed Care – PPO | Attending: Emergency Medicine | Admitting: Emergency Medicine

## 2021-12-13 DIAGNOSIS — R519 Headache, unspecified: Secondary | ICD-10-CM | POA: Insufficient documentation

## 2021-12-13 DIAGNOSIS — S161XXA Strain of muscle, fascia and tendon at neck level, initial encounter: Secondary | ICD-10-CM | POA: Diagnosis not present

## 2021-12-13 DIAGNOSIS — I10 Essential (primary) hypertension: Secondary | ICD-10-CM

## 2021-12-13 DIAGNOSIS — Z7982 Long term (current) use of aspirin: Secondary | ICD-10-CM | POA: Diagnosis not present

## 2021-12-13 DIAGNOSIS — R0789 Other chest pain: Secondary | ICD-10-CM | POA: Insufficient documentation

## 2021-12-13 DIAGNOSIS — R109 Unspecified abdominal pain: Secondary | ICD-10-CM | POA: Insufficient documentation

## 2021-12-13 DIAGNOSIS — Y92410 Unspecified street and highway as the place of occurrence of the external cause: Secondary | ICD-10-CM | POA: Diagnosis not present

## 2021-12-13 DIAGNOSIS — S199XXA Unspecified injury of neck, initial encounter: Secondary | ICD-10-CM | POA: Diagnosis present

## 2021-12-13 DIAGNOSIS — Z79899 Other long term (current) drug therapy: Secondary | ICD-10-CM | POA: Insufficient documentation

## 2021-12-13 LAB — BASIC METABOLIC PANEL
Anion gap: 9 (ref 5–15)
BUN: 11 mg/dL (ref 8–23)
CO2: 27 mmol/L (ref 22–32)
Calcium: 10.1 mg/dL (ref 8.9–10.3)
Chloride: 104 mmol/L (ref 98–111)
Creatinine, Ser: 0.8 mg/dL (ref 0.44–1.00)
GFR, Estimated: >60 mL/min (ref >60)
Glucose, Bld: 96 mg/dL (ref 70–99)
Potassium: 3.6 mmol/L (ref 3.5–5.1)
Sodium: 140 mmol/L (ref 135–145)

## 2021-12-13 LAB — CBC
HCT: 40.3 % (ref 36.0–46.0)
Hemoglobin: 13.8 g/dL (ref 12.0–15.0)
MCH: 30.8 pg (ref 26.0–34.0)
MCHC: 34.2 g/dL (ref 30.0–36.0)
MCV: 90 fL (ref 80.0–100.0)
Platelets: 161 10*3/uL (ref 150–400)
RBC: 4.48 MIL/uL (ref 3.87–5.11)
RDW: 12.8 % (ref 11.5–15.5)
WBC: 5.4 10*3/uL (ref 4.0–10.5)
nRBC: 0 % (ref 0.0–0.2)

## 2021-12-13 LAB — I-STAT CHEM 8, ED
BUN: 12 mg/dL (ref 8–23)
Calcium, Ion: 1.21 mmol/L (ref 1.15–1.40)
Chloride: 102 mmol/L (ref 98–111)
Creatinine, Ser: 0.8 mg/dL (ref 0.44–1.00)
Glucose, Bld: 96 mg/dL (ref 70–99)
HCT: 41 % (ref 36.0–46.0)
Hemoglobin: 13.9 g/dL (ref 12.0–15.0)
Potassium: 3.4 mmol/L — ABNORMAL LOW (ref 3.5–5.1)
Sodium: 140 mmol/L (ref 135–145)
TCO2: 26 mmol/L (ref 22–32)

## 2021-12-13 MED ORDER — LIDOCAINE 5 % EX PTCH
1.0000 | MEDICATED_PATCH | CUTANEOUS | 0 refills | Status: DC
Start: 2021-12-13 — End: 2022-07-10

## 2021-12-13 MED ORDER — IOHEXOL 300 MG/ML  SOLN
100.0000 mL | Freq: Once | INTRAMUSCULAR | Status: AC | PRN
  Administered 2021-12-13: 100 mL via INTRAVENOUS

## 2021-12-13 MED ORDER — HYDROCODONE-ACETAMINOPHEN 5-325 MG PO TABS: 1.0000 | ORAL_TABLET | Freq: Four times a day (QID) | ORAL | 0 refills | Status: DC | PRN
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
## 2021-12-13 NOTE — ED Notes (Signed)
Pt reports she has to go home, she has a sick husband at home. Notified EDP. Tomi Bamberger MD at bedside

## 2021-12-13 NOTE — Discharge Instructions (Addendum)
You can safely take Tylenol for pain.  You can take the hydrocodone for more severe pain.  Use the lidocaine patch to help with pain and discomfort as well.  Try applying ice.  Expect to be stiff and sore for the next week or so.  Return to the ER for difficulty breathing, abdominal pain, vomiting
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
## 2021-12-13 NOTE — ED Notes (Signed)
Patient transported to CT 

## 2021-12-27 ENCOUNTER — Ambulatory Visit: Payer: BC Managed Care – PPO | Admitting: Cardiology

## 2021-12-27 ENCOUNTER — Encounter: Payer: Self-pay | Admitting: Cardiology

## 2021-12-27 VITALS — BP 132/79 | HR 75 | Temp 98.0°F | Resp 16 | Ht 64.0 in | Wt 213.0 lb

## 2021-12-27 DIAGNOSIS — E7800 Pure hypercholesterolemia, unspecified: Secondary | ICD-10-CM

## 2021-12-27 DIAGNOSIS — E119 Type 2 diabetes mellitus without complications: Secondary | ICD-10-CM

## 2021-12-27 DIAGNOSIS — I2510 Atherosclerotic heart disease of native coronary artery without angina pectoris: Secondary | ICD-10-CM

## 2021-12-27 DIAGNOSIS — E66812 Obesity, class 2: Secondary | ICD-10-CM

## 2021-12-27 DIAGNOSIS — I10 Essential (primary) hypertension: Secondary | ICD-10-CM

## 2021-12-27 DIAGNOSIS — Z6836 Body mass index (BMI) 36.0-36.9, adult: Secondary | ICD-10-CM

## 2021-12-27 MED ORDER — SEMAGLUTIDE(0.25 OR 0.5MG/DOS) 2 MG/3ML ~~LOC~~ SOPN: 0.2500 mg | PEN_INJECTOR | SUBCUTANEOUS | 1 refills | Status: DC

## 2021-12-27 MED ORDER — SEMAGLUTIDE(0.25 OR 0.5MG/DOS) 2 MG/3ML ~~LOC~~ SOPN
PEN_INJECTOR | Freq: Once | SUBCUTANEOUS | Status: AC
  Administered 2021-12-27: 2 mg via SUBCUTANEOUS

## 2021-12-27 MED ORDER — SEMAGLUTIDE (1 MG/DOSE) 4 MG/3ML ~~LOC~~ SOPN: 0.5000 mg | PEN_INJECTOR | SUBCUTANEOUS | 1 refills | Status: DC
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
## 2022-01-15 ENCOUNTER — Other Ambulatory Visit: Payer: Self-pay | Admitting: Student

## 2022-01-15 DIAGNOSIS — Z9861 Coronary angioplasty status: Secondary | ICD-10-CM

## 2022-02-04 ENCOUNTER — Other Ambulatory Visit: Payer: Self-pay

## 2022-02-04 DIAGNOSIS — I2510 Atherosclerotic heart disease of native coronary artery without angina pectoris: Secondary | ICD-10-CM

## 2022-02-04 DIAGNOSIS — E119 Type 2 diabetes mellitus without complications: Secondary | ICD-10-CM

## 2022-02-04 MED ORDER — SEMAGLUTIDE(0.25 OR 0.5MG/DOS) 2 MG/3ML ~~LOC~~ SOPN: 0.2500 mg | PEN_INJECTOR | SUBCUTANEOUS | 1 refills | Status: DC

## 2022-02-06 ENCOUNTER — Other Ambulatory Visit: Payer: Self-pay

## 2022-02-18 ENCOUNTER — Other Ambulatory Visit: Payer: Self-pay | Admitting: Cardiology

## 2022-02-28 ENCOUNTER — Ambulatory Visit: Payer: BC Managed Care – PPO | Admitting: Cardiology

## 2022-02-28 ENCOUNTER — Encounter: Payer: Self-pay | Admitting: Cardiology

## 2022-02-28 VITALS — BP 110/72 | HR 91 | Temp 98.1°F | Resp 16 | Ht 64.0 in | Wt 201.4 lb

## 2022-02-28 DIAGNOSIS — I2510 Atherosclerotic heart disease of native coronary artery without angina pectoris: Secondary | ICD-10-CM

## 2022-02-28 DIAGNOSIS — E119 Type 2 diabetes mellitus without complications: Secondary | ICD-10-CM

## 2022-02-28 MED ORDER — SEMAGLUTIDE (1 MG/DOSE) 4 MG/3ML ~~LOC~~ SOPN: 1.0000 mg | PEN_INJECTOR | SUBCUTANEOUS | 1 refills | Status: DC

## 2022-02-28 NOTE — Progress Notes (Signed)
Primary Physician/Referring:  Donald Prose, MD  Patient ID: Emma Combs, female    DOB: [birth year], 66 y.o.   MRN: [id number]  Chief Complaint  Patient presents with   Coronary Artery Disease   Hypertension   Hyperlipidemia   Follow-up    2 month   HPI:    Emma Combs  is a 66 y.o. AAF with known coronary artery disease with history of proximal and mid LAD stent placed on 08/11/2013 and distal RCA stent placed on 02/28/2014, presenting with exertional chest pain the 1st time and syncope the 2nd time. She has hyperlipidemia, family history of premature coronary artery disease, DM, obesity and mild chronic thrombocytopenia.   In view of underlying coronary artery disease, strong family still premature coronary disease, diabetes mellitus and morbid obesity, I started her on Ozempic.  Patient states that she feels very well, has been tolerating the medication, she is updated the dose to 0.5 mg, she has had mild nausea but overall tolerating well and keeps herself well-hydrated.  She is excited that she has started to lose weight and is started to feel better.  Denies chest pain or dyspnea.  Past Medical History:  Diagnosis Date   Anginal pain ([place])    Arthritis    "joints ache; hips" (02/28/2014)   Borderline diabetes mellitus    Coronary atherosclerosis of native coronary artery    08/12/2013: PTCA and stenting of the proximal and mid LAD with overlapping 3.0 x 28 mm in the proximal to mid segment and the ostial 3.0 x 12 mm Promus Premier drug-eluting stent. 02/28/2014: Stent distal RCA 2.75x15 Xience Alpine DES. Mod disease in Ramus intermediate. EF 55%   Glucose intolerance (impaired glucose tolerance)    High cholesterol    Obesity    Uterine fibroid     Social History   Tobacco Use   Smoking status: Never   Smokeless tobacco: Never  Substance Use Topics   Alcohol use: Yes    Alcohol/week: 7.0 standard drinks of alcohol    Types: 7 Glasses of wine per week    Comment:  occasionally  Marital Status: Married    ROS  Review of Systems  Cardiovascular:  Negative for chest pain, dyspnea on exertion and leg swelling.  Gastrointestinal:  Negative for melena.   Objective      02/28/2022    9:04 AM 12/27/2021    9:15 AM 12/13/2021    9:45 PM  Vitals with BMI  Height '5\' 4"'  '5\' 4"'    Weight 201 lbs 6 oz 213 lbs   BMI 19.41 74.08   Systolic 144 818 563  Diastolic 72 79 89  Pulse 91 75 68    Blood pressure 110/72, pulse 91, temperature 98.1 F (36.7 C), temperature source Temporal, resp. rate 16, height '5\' 4"'  (1.626 m), weight 201 lb 6.4 oz (91.4 kg), SpO2 97 %. Body mass index is 34.57 kg/m.   Physical Exam Constitutional:      Comments: Moderately built and moderately obese in no acute distress.  Neck:     Thyroid: No thyromegaly.     Vascular: No JVD.  Cardiovascular:     Rate and Rhythm: Normal rate and regular rhythm.     Pulses: Normal pulses and intact distal pulses.     Heart sounds: S1 normal and S2 normal. Murmur heard.     Early systolic murmur is present with a grade of 1/6 at the upper right sternal border and apex.     No gallop.  Pulmonary:     Effort: Pulmonary effort is normal.     Breath sounds: Normal breath sounds.  Abdominal:     General: Bowel sounds are normal.     Palpations: Abdomen is soft.  Musculoskeletal:        General: Normal range of motion.     Cervical back: Neck supple.  Skin:    General: Skin is warm and dry.  Neurological:     Mental Status: She is alert.    Radiology: No results found.  Laboratory examination:   Recent Labs    12/13/21 1916 12/13/21 1923  NA 140 140  K 3.6 3.4*  CL 104 102  CO2 27  --   GLUCOSE 96 96  BUN 11 12  CREATININE 0.80 0.80  CALCIUM 10.1  --   GFRNONAA >60  --       Latest Ref Rng & Units 12/13/2021    7:23 PM 12/13/2021    7:16 PM 04/15/2019    9:39 AM  CMP  Glucose 70 - 99 mg/dL 96  96  149   BUN 8 - 23 mg/dL '12  11  11   ' Creatinine 0.44 - 1.00 mg/dL 0.80   0.80  0.91   Sodium 135 - 145 mmol/L 140  140  141   Potassium 3.5 - 5.1 mmol/L 3.4  3.6  4.4   Chloride 98 - 111 mmol/L 102  104  102   CO2 22 - 32 mmol/L  27  27   Calcium 8.9 - 10.3 mg/dL  10.1  9.5       Latest Ref Rng & Units 12/13/2021    7:23 PM 12/13/2021    7:16 PM 01/10/2017    4:55 PM  CBC  WBC 4.0 - 10.5 K/uL  5.4  4.1   Hemoglobin 12.0 - 15.0 g/dL 13.9  13.8  13.0   Hematocrit 36.0 - 46.0 % 41.0  40.3  38.9   Platelets 150 - 400 K/uL  161  112     External labs:  Labs 11/27/2021:  A1c 6.6%.  BUN 16, creatinine 0.81, EGFR 80 mL, potassium 4.5, LFTs normal.  Labs 05/29/2021:  Total cholesterol 145, triglycerides 68, HDL 76, LDL 53.  Non-HDL cholesterol 68.   Medications and allergies   Allergies  Allergen Reactions   Bactrim Other (See Comments)    Light headed, passing out    Current Outpatient Medications:    ALOE VERA PO, Take 8 oz by mouth daily., Disp: , Rfl:    ascorbic acid (VITAMIN C) 500 MG tablet, Take 500 mg by mouth daily., Disp: , Rfl:    aspirin 81 MG chewable tablet, Chew 1 tablet (81 mg total) by mouth daily. (Patient taking differently: Chew 162 mg by mouth daily.), Disp: , Rfl:    atorvastatin (LIPITOR) 40 MG tablet, TAKE 1 TABLET(40 MG) BY MOUTH DAILY, Disp: 90 tablet, Rfl: 1   Coenzyme Q10 (Q-10 CO-ENZYME PO), Take 1 packet by mouth daily., Disp: , Rfl:    hydrocortisone (ANUSOL-HC) 25 MG suppository, Place 1 suppository (25 mg total) rectally 2 (two) times daily as needed for hemorrhoids., Disp: 24 suppository, Rfl: 0   lidocaine (LIDODERM) 5 %, Place 1 patch onto the skin daily. Remove & Discard patch within 12 hours or as directed by MD, Disp: 14 patch, Rfl: 0   losartan-hydrochlorothiazide (HYZAAR) 50-12.5 MG tablet, TAKE 1 TABLET BY MOUTH EVERY MORNING, Disp: 30 tablet, Rfl: 11   metFORMIN (GLUCOPHAGE-XR) 500 MG 24 hr  tablet, Take 1,000 mg by mouth daily., Disp: , Rfl:    metoprolol succinate (TOPROL-XL) 50 MG 24 hr tablet, TAKE 1  TABLET BY MOUTH DAILY WITH OR IMMEDIATELY FOLLOWING A MEAL, Disp: 90 tablet, Rfl: 2   Multiple Vitamin (MULTIVITAMIN WITH MINERALS) TABS tablet, Take 2 tablets by mouth daily., Disp: , Rfl:    nitroGLYCERIN (NITROSTAT) 0.4 MG SL tablet, Place 0.4 mg under the tongue every 5 (five) minutes as needed for chest pain., Disp: , Rfl:    Omega-3 Fatty Acids (OMEGA 3 PO), Take 2 capsules by mouth daily with breakfast., Disp: , Rfl:    OVER THE COUNTER MEDICATION, Forever focus, Disp: , Rfl:    OVER THE COUNTER MEDICATION, Forever move, Disp: , Rfl:    Semaglutide, 1 MG/DOSE, 4 MG/3ML SOPN, Inject 1 mg into the skin once a week., Disp: 3 mL, Rfl: 1   Cardiac Studies:   Exercise sestamibi stress test 08/01/2013: 1. Resting EKG showed normal sinus rhythm, poor R wave progression, Stress EKG was positive for ischemia.  Patient had frequent PVCs, ventricular bigeminy at peak exercise.  Patient had 2 mm horizontal ST segment depression 3 minutes into recovery that persisted for 5.5 minutes into recovery. Patient exercised on BRUCE PROTOCOL for 5 minutes 58 seconds. The maximum work level achieved was 7.3 MET's.  The test was terminated due to achievement of the target heart rate. 2. Perfusion imaging study demonstrates a moderate to large size severe septal ischemia extending from the base to the apex and includes apical anterior and apex.  This reached statistical significance.   Dynamic  QGS  images  reveal septal hypokinesis and apical hypokinesis. Left ventricular ejection fraction was estimated to be 52%.  This represents a high risk study, consider further cardiac workup including coronary angiography if clinically indicated.  Cardiac cath 02/28/2014: Distal RCA 2.75x65m Xience DES. EF 55%. 08/11/2013: Stenting ostial and proximal and mid LAD with overlapping 3.0x28, 3.0x12 Promus premier DES. Large Ramus intermediate with proximal 40% stenosis.  Echocardiogram 06/05/2016: Left ventricle cavity is normal in  size. Normal global wall motion. Doppler evidence of grade I (impaired) diastolic dysfunction, normal LAP. Calculated EF 55%. Mild (Grade I) mitral regurgitation. Mild tricuspid regurgitation. No evidence of pulmonary hypertension. Mild pulmonic regurgitation.  EKG:  EKG 12/27/2021: Normal sinus rhythm with rate of 81 bpm, normal axis.  Nonspecific T wave flattening.  No evidence of ischemia.  No significant change from 04/26/2020.  Assessment     ICD-10-CM   1. Coronary artery disease involving native coronary artery of native heart without angina pectoris  I25.10 Semaglutide, 1 MG/DOSE, 4 MG/3ML SOPN    2. Type 2 diabetes mellitus without complication, without long-term current use of insulin (HCC)  E11.9 Semaglutide, 1 MG/DOSE, 4 MG/3ML SOPN      Meds ordered this encounter  Medications   Semaglutide, 1 MG/DOSE, 4 MG/3ML SOPN    Sig: Inject 1 mg into the skin once a week.    Dispense:  3 mL    Refill:  1   Medications Discontinued During This Encounter  Medication Reason   HYDROcodone-acetaminophen (NORCO/VICODIN) 5-325 MG tablet    Semaglutide,0.25 or 0.5MG/DOS, 2 MG/3ML SOPN Dose change   Semaglutide, 1 MG/DOSE, 4 MG/3ML SOPN Dose change      Recommendations:   MVirgina Deakins is a 66y.o. AAF with known coronary artery disease with history of proximal and mid LAD stent placed on 08/11/2013 and distal RCA stent placed on 02/28/2014, presenting with exertional  chest pain the 1st time and syncope the 2nd time. She has hyperlipidemia, family history of premature coronary artery disease, DM, obesity and mild chronic thrombocytopenia.   In view of underlying coronary artery disease, strong family still premature coronary disease, diabetes mellitus and morbid obesity, I started her on Ozempic, she has lost about 12 to 13 pounds in weight, feels well, tolerating the medication at 0.5 mg dose.  Will increase it to 1 mg dose per week.  She has not had any recurrence of angina pectoris.   Weight loss again discussed with the patient and positive reinforcement and lifestyle modification extensively discussed with the patient.  Prescription for 1 mg dose sent.  I would like to see her back again in 6 weeks for follow-up.     Adrian Prows, MD, [facility] 02/28/2022, 9:49 AM Office: [phone] Pager: [phone]

## 2022-04-10 ENCOUNTER — Ambulatory Visit: Payer: BC Managed Care – PPO | Admitting: Cardiology

## 2022-04-10 ENCOUNTER — Encounter: Payer: Self-pay | Admitting: Cardiology

## 2022-04-10 VITALS — BP 116/72 | HR 83 | Temp 98.0°F | Resp 16 | Ht 64.0 in | Wt 197.0 lb

## 2022-04-10 DIAGNOSIS — I2510 Atherosclerotic heart disease of native coronary artery without angina pectoris: Secondary | ICD-10-CM

## 2022-04-10 DIAGNOSIS — K5903 Drug induced constipation: Secondary | ICD-10-CM

## 2022-04-10 DIAGNOSIS — E6609 Other obesity due to excess calories: Secondary | ICD-10-CM

## 2022-04-10 MED ORDER — METAMUCIL SMOOTH TEXTURE 58.6 % PO POWD: 1.0000 | Freq: Three times a day (TID) | ORAL | 12 refills | Status: AC

## 2022-04-10 NOTE — Progress Notes (Signed)
Primary Physician/Referring:  Donald Prose, MD  Patient ID: Emma Combs, female    DOB: 1956-01-09, 66 y.o.   MRN: [id number]  No chief complaint on file.  HPI:    Emma Combs  is a 66 y.o. AAF with known coronary artery disease with history of proximal and mid LAD stent placed on 08/11/2013 and distal RCA stent placed on 02/28/2014, presenting with exertional chest pain the 1st time and syncope the 2nd time. She has hyperlipidemia, family history of premature coronary artery disease, DM, obesity and mild chronic thrombocytopenia.  She is presently on Ozempic for weight loss, she has lost a total of about 15 to 16 pounds over the past 3 months.  Has developed mild constipation.  Denies chest pain or dyspnea.  Past Medical History:  Diagnosis Date   Anginal pain ([place])    Arthritis    "joints ache; hips" (02/28/2014)   Borderline diabetes mellitus    Coronary atherosclerosis of native coronary artery    08/12/2013: PTCA and stenting of the proximal and mid LAD with overlapping 3.0 x 28 mm in the proximal to mid segment and the ostial 3.0 x 12 mm Promus Premier drug-eluting stent. 02/28/2014: Stent distal RCA 2.75x15 Xience Alpine DES. Mod disease in Ramus intermediate. EF 55%   Glucose intolerance (impaired glucose tolerance)    High cholesterol    Obesity    Uterine fibroid     Social History   Tobacco Use   Smoking status: Never   Smokeless tobacco: Never  Substance Use Topics   Alcohol use: Yes    Alcohol/week: 7.0 standard drinks of alcohol    Types: 7 Glasses of wine per week    Comment: occasionally  Marital Status: Married    ROS  Review of Systems  Cardiovascular:  Negative for chest pain, dyspnea on exertion and leg swelling.  Gastrointestinal:  Negative for melena.   Objective      04/10/2022   12:12 PM 02/28/2022    9:04 AM 12/27/2021    9:15 AM  Vitals with BMI  Height _0  _1  _2   Weight 197 lbs 201 lbs 6 oz 213 lbs  BMI 33.8 44.01 02.72   Systolic 536 644 034  Diastolic 72 72 79  Pulse 83 91 75    Blood pressure 116/72, pulse 83, temperature 98 F (36.7 C), temperature source Temporal, resp. rate 16, height _3  (1.626 m), weight 197 lb (89.4 kg), SpO2 99 %. Body mass index is 33.81 kg/m.   Physical Exam Constitutional:      Appearance: She is obese.  Neck:     Vascular: No carotid bruit or JVD.  Cardiovascular:     Rate and Rhythm: Normal rate and regular rhythm.     Pulses: Normal pulses and intact distal pulses.     Heart sounds: S1 normal and S2 normal. Murmur heard.     Early systolic murmur is present with a grade of 1/6 at the upper right sternal border and apex.     No gallop.  Pulmonary:     Effort: Pulmonary effort is normal.     Breath sounds: Normal breath sounds.  Abdominal:     General: Bowel sounds are normal.     Palpations: Abdomen is soft.  Musculoskeletal:     Cervical back: Neck supple.     Right lower leg: No edema.     Left lower leg: No edema.    Radiology: No results found.  Laboratory examination:  Recent Labs    12/13/21 1916 12/13/21 1923  NA 140 140  K 3.6 3.4*  CL 104 102  CO2 27  --   GLUCOSE 96 96  BUN 11 12  CREATININE 0.80 0.80  CALCIUM 10.1  --   GFRNONAA >60  --       Latest Ref Rng & Units 12/13/2021    7:23 PM 12/13/2021    7:16 PM 04/15/2019    9:39 AM  CMP  Glucose 70 - 99 mg/dL 96  96  149   BUN 8 - 23 mg/dL _0 Creatinine 0.44 - 1.00 mg/dL 0.80  0.80  0.91   Sodium 135 - 145 mmol/L 140  140  141   Potassium 3.5 - 5.1 mmol/L 3.4  3.6  4.4   Chloride 98 - 111 mmol/L 102  104  102   CO2 22 - 32 mmol/L  27  27   Calcium 8.9 - 10.3 mg/dL  10.1  9.5       Latest Ref Rng & Units 12/13/2021    7:23 PM 12/13/2021    7:16 PM 01/10/2017    4:55 PM  CBC  WBC 4.0 - 10.5 K/uL  5.4  4.1   Hemoglobin 12.0 - 15.0 g/dL 13.9  13.8  13.0   Hematocrit 36.0 - 46.0 % 41.0  40.3  38.9   Platelets 150 - 400 K/uL  161  112     External labs:  Labs  11/27/2021:  A1c 6.6%.  BUN 16, creatinine 0.81, EGFR 80 mL, potassium 4.5, LFTs normal.  Labs 05/29/2021:  Total cholesterol 145, triglycerides 68, HDL 76, LDL 53.  Non-HDL cholesterol 68.   Medications and allergies   Allergies  Allergen Reactions   Bactrim Other (See Comments)    Light headed, passing out    Current Outpatient Medications:    ALOE VERA PO, Take 8 oz by mouth daily., Disp: , Rfl:    ascorbic acid (VITAMIN C) 500 MG tablet, Take 500 mg by mouth daily., Disp: , Rfl:    aspirin 81 MG chewable tablet, Chew 1 tablet (81 mg total) by mouth daily. (Patient taking differently: Chew 162 mg by mouth daily.), Disp: , Rfl:    atorvastatin (LIPITOR) 40 MG tablet, TAKE 1 TABLET(40 MG) BY MOUTH DAILY, Disp: 90 tablet, Rfl: 1   Coenzyme Q10 (Q-10 CO-ENZYME PO), Take 1 packet by mouth daily., Disp: , Rfl:    hydrocortisone (ANUSOL-HC) 25 MG suppository, Place 1 suppository (25 mg total) rectally 2 (two) times daily as needed for hemorrhoids., Disp: 24 suppository, Rfl: 0   lidocaine (LIDODERM) 5 %, Place 1 patch onto the skin daily. Remove & Discard patch within 12 hours or as directed by MD, Disp: 14 patch, Rfl: 0   losartan-hydrochlorothiazide (HYZAAR) 50-12.5 MG tablet, TAKE 1 TABLET BY MOUTH EVERY MORNING, Disp: 30 tablet, Rfl: 11   metFORMIN (GLUCOPHAGE-XR) 500 MG 24 hr tablet, Take 1,000 mg by mouth daily., Disp: , Rfl:    metoprolol succinate (TOPROL-XL) 50 MG 24 hr tablet, TAKE 1 TABLET BY MOUTH DAILY WITH OR IMMEDIATELY FOLLOWING A MEAL, Disp: 90 tablet, Rfl: 2   Multiple Vitamin (MULTIVITAMIN WITH MINERALS) TABS tablet, Take 2 tablets by mouth daily., Disp: , Rfl:    nitroGLYCERIN (NITROSTAT) 0.4 MG SL tablet, Place 0.4 mg under the tongue every 5 (five) minutes as needed for chest pain., Disp: , Rfl:    Omega-3 Fatty Acids (OMEGA 3 PO), Take 2  capsules by mouth daily with breakfast., Disp: , Rfl:    OVER THE COUNTER MEDICATION, Forever focus, Disp: , Rfl:    OVER THE  COUNTER MEDICATION, Forever move, Disp: , Rfl:    psyllium (METAMUCIL SMOOTH TEXTURE) 58.6 % powder, Take 1 packet by mouth 3 (three) times daily. 1 tablespoon every night, Disp: 283 g, Rfl: 12   Semaglutide, 1 MG/DOSE, 4 MG/3ML SOPN, Inject 1 mg into the skin once a week., Disp: 3 mL, Rfl: 1   Cardiac Studies:   Exercise sestamibi stress test 08/01/2013: 1. Resting EKG showed normal sinus rhythm, poor R wave progression, Stress EKG was positive for ischemia.  Patient had frequent PVCs, ventricular bigeminy at peak exercise.  Patient had 2 mm horizontal ST segment depression 3 minutes into recovery that persisted for 5.5 minutes into recovery. Patient exercised on BRUCE PROTOCOL for 5 minutes 58 seconds. The maximum work level achieved was 7.3 MET's.  The test was terminated due to achievement of the target heart rate. 2. Perfusion imaging study demonstrates a moderate to large size severe septal ischemia extending from the base to the apex and includes apical anterior and apex.  This reached statistical significance.   Dynamic  QGS  images  reveal septal hypokinesis and apical hypokinesis. Left ventricular ejection fraction was estimated to be 52%.  This represents a high risk study, consider further cardiac workup including coronary angiography if clinically indicated.  Cardiac cath 02/28/2014: Distal RCA 2.75x93m Xience DES. EF 55%. 08/11/2013: Stenting ostial and proximal and mid LAD with overlapping 3.0x28, 3.0x12 Promus premier DES. Large Ramus intermediate with proximal 40% stenosis.  Echocardiogram 06/05/2016: Left ventricle cavity is normal in size. Normal global wall motion. Doppler evidence of grade I (impaired) diastolic dysfunction, normal LAP. Calculated EF 55%. Mild (Grade I) mitral regurgitation. Mild tricuspid regurgitation. No evidence of pulmonary hypertension. Mild pulmonic regurgitation.  EKG:  EKG 12/27/2021: Normal sinus rhythm with rate of 81 bpm, normal axis.  Nonspecific T  wave flattening.  No evidence of ischemia.  No significant change from 04/26/2020.  Assessment     ICD-10-CM   1. Class 1 obesity due to excess calories with serious comorbidity and body mass index (BMI) of 33.0 to 33.9 in adult  E66.09    Z68.33     2. Drug-induced constipation  K59.03 psyllium (METAMUCIL SMOOTH TEXTURE) 58.6 % powder    3. Coronary artery disease involving native coronary artery of native heart without angina pectoris  I25.10       Meds ordered this encounter  Medications   psyllium (METAMUCIL SMOOTH TEXTURE) 58.6 % powder    Sig: Take 1 packet by mouth 3 (three) times daily. 1 tablespoon every night    Dispense:  283 g    Refill:  12   There are no discontinued medications.    Recommendations:   Emma Combs is a 66y.o. AAF with known coronary artery disease with history of proximal and mid LAD stent placed on 08/11/2013 and distal RCA stent placed on 02/28/2014, presenting with exertional chest pain the 1st time and syncope the 2nd time. She has hyperlipidemia, family history of premature coronary artery disease, DM, obesity and mild chronic thrombocytopenia.  She is presently on Ozempic for weight loss, she has lost a total of about 15 to 16 pounds over the past 3 months.  She is tolerating 1 mg dose, however is developing mild constipation, advised her to use Metamucil on a daily basis.  I have again encouraged her  to abstain from excessive eating and also snacking in between.  Counseling for exercise was also performed today.  Otherwise blood pressure is well controlled, she has not had any recurrence of angina pectoris, important aspect of weight loss, healthy living and prevention of future disease progression reinforced with the patient.  I would like to see her back in 3 months for follow-up specifically with regard to lifestyle modification obesity.  It was a 25-minute office visit encounter with counseling.   Adrian Prows, MD, [facility] 04/10/2022, 12:50  PM Office: [phone] Pager: [phone]

## 2022-04-11 ENCOUNTER — Ambulatory Visit: Payer: BC Managed Care – PPO | Admitting: Cardiology

## 2022-04-24 ENCOUNTER — Other Ambulatory Visit: Payer: Self-pay | Admitting: Cardiology

## 2022-04-24 DIAGNOSIS — E119 Type 2 diabetes mellitus without complications: Secondary | ICD-10-CM

## 2022-04-24 DIAGNOSIS — I2510 Atherosclerotic heart disease of native coronary artery without angina pectoris: Secondary | ICD-10-CM

## 2022-04-25 ENCOUNTER — Ambulatory Visit: Payer: BC Managed Care – PPO | Admitting: Student

## 2022-06-03 ENCOUNTER — Telehealth: Payer: Self-pay

## 2022-06-03 NOTE — Telephone Encounter (Signed)
Yes, fatty meal will make her sick

## 2022-06-03 NOTE — Telephone Encounter (Signed)
Pt is wondering if what she ate on Sunday which had a lot of Palm oil in it is causing her to have severe diarrhea for 2 days. She is wondering if this palm oil can this interact with her ozempic and cause her severe diarrhea

## 2022-07-10 ENCOUNTER — Encounter: Payer: Self-pay | Admitting: Cardiology

## 2022-07-10 ENCOUNTER — Ambulatory Visit: Payer: BC Managed Care – PPO | Admitting: Cardiology

## 2022-07-10 VITALS — BP 112/65 | HR 88 | Ht 64.0 in | Wt 186.6 lb

## 2022-07-10 DIAGNOSIS — I2510 Atherosclerotic heart disease of native coronary artery without angina pectoris: Secondary | ICD-10-CM

## 2022-07-10 DIAGNOSIS — E119 Type 2 diabetes mellitus without complications: Secondary | ICD-10-CM

## 2022-07-10 DIAGNOSIS — E6609 Other obesity due to excess calories: Secondary | ICD-10-CM

## 2022-07-10 MED ORDER — OZEMPIC (2 MG/DOSE) 8 MG/3ML ~~LOC~~ SOPN: 2.0000 mg | PEN_INJECTOR | SUBCUTANEOUS | 2 refills | Status: DC

## 2022-07-10 NOTE — Progress Notes (Signed)
Primary Physician/Referring:  Donald Prose, MD  Patient ID: Emma Combs, female    DOB: 1956/02/11, 67 y.o.   MRN: [id number]  Chief Complaint  Patient presents with   Class 1 obesity due to excess calories with serious comorbi   Coronary artery disease involving native coronary artery of   Hypertension   Follow-up    3 months   HPI:    Emma Combs  is a 67 y.o. AAF with known coronary artery disease with history of proximal and mid LAD stent placed on 08/11/2013 and distal RCA stent placed on 02/28/2014, presenting with exertional chest pain the 1st time and syncope the 2nd time. She has hyperlipidemia, family history of premature coronary artery disease, DM, obesity and mild chronic thrombocytopenia.  She is presently on Ozempic for weight loss, she has lost a total of about 15 to 16 pounds over the past 3 months.  Has developed mild constipation.  Denies chest pain or dyspnea.  Past Medical History:  Diagnosis Date   Anginal pain ([place])    Arthritis    "joints ache; hips" (02/28/2014)   Borderline diabetes mellitus    Coronary atherosclerosis of native coronary artery    08/12/2013: PTCA and stenting of the proximal and mid LAD with overlapping 3.0 x 28 mm in the proximal to mid segment and the ostial 3.0 x 12 mm Promus Premier drug-eluting stent. 02/28/2014: Stent distal RCA 2.75x15 Xience Alpine DES. Mod disease in Ramus intermediate. EF 55%   Glucose intolerance (impaired glucose tolerance)    High cholesterol    Obesity    Uterine fibroid     Social History   Tobacco Use   Smoking status: Never   Smokeless tobacco: Never  Substance Use Topics   Alcohol use: Yes    Alcohol/week: 7.0 standard drinks of alcohol    Types: 7 Glasses of wine per week    Comment: occasionally  Marital Status: Married    ROS  Review of Systems  Cardiovascular:  Negative for chest pain, dyspnea on exertion and leg swelling.  Gastrointestinal:  Negative for melena.   Objective       07/10/2022   11:44 AM 04/10/2022   12:12 PM 02/28/2022    9:04 AM  Vitals with BMI  Height '5\' 4"'$  '5\' 4"'$  '5\' 4"'$   Weight 186 lbs 10 oz 197 lbs 201 lbs 6 oz  BMI 32.01 50.5 69.79  Systolic 480 165 537  Diastolic 65 72 72  Pulse 88 83 91    Blood pressure 112/65, pulse 88, height '5\' 4"'$  (1.626 m), weight 186 lb 9.6 oz (84.6 kg), SpO2 99 %. Body mass index is 32.03 kg/m.   Physical Exam Constitutional:      Appearance: She is obese.  Neck:     Vascular: No carotid bruit or JVD.  Cardiovascular:     Rate and Rhythm: Normal rate and regular rhythm.     Pulses: Normal pulses and intact distal pulses.     Heart sounds: S1 normal and S2 normal. No murmur heard.    No gallop.  Pulmonary:     Effort: Pulmonary effort is normal.     Breath sounds: Normal breath sounds.  Abdominal:     General: Bowel sounds are normal.     Palpations: Abdomen is soft.  Musculoskeletal:     Cervical back: Neck supple.     Right lower leg: No edema.     Left lower leg: No edema.    Radiology: No results  found.  Laboratory examination:   Recent Labs    12/13/21 1916 12/13/21 1923  NA 140 140  K 3.6 3.4*  CL 104 102  CO2 27  --   GLUCOSE 96 96  BUN 11 12  CREATININE 0.80 0.80  CALCIUM 10.1  --   GFRNONAA >60  --       Latest Ref Rng & Units 12/13/2021    7:23 PM 12/13/2021    7:16 PM 04/15/2019    9:39 AM  CMP  Glucose 70 - 99 mg/dL 96  96  149   BUN 8 - 23 mg/dL '12  11  11   '$ Creatinine 0.44 - 1.00 mg/dL 0.80  0.80  0.91   Sodium 135 - 145 mmol/L 140  140  141   Potassium 3.5 - 5.1 mmol/L 3.4  3.6  4.4   Chloride 98 - 111 mmol/L 102  104  102   CO2 22 - 32 mmol/L  27  27   Calcium 8.9 - 10.3 mg/dL  10.1  9.5       Latest Ref Rng & Units 12/13/2021    7:23 PM 12/13/2021    7:16 PM 01/10/2017    4:55 PM  CBC  WBC 4.0 - 10.5 K/uL  5.4  4.1   Hemoglobin 12.0 - 15.0 g/dL 13.9  13.8  13.0   Hematocrit 36.0 - 46.0 % 41.0  40.3  38.9   Platelets 150 - 400 K/uL  161  112     External  labs:  Labs 05/27/2022:  A1c 6.0%.  Serum glucose 85 mg, BUN 10, creatinine 0.74, EGFR 89 mL, potassium 4.3, LFTs normal.  Labs 11/27/2021:  A1c 6.6%.  BUN 16, creatinine 0.81, EGFR 80 mL, potassium 4.5, LFTs normal.  Labs 05/29/2021:  Total cholesterol 145, triglycerides 68, HDL 76, LDL 53.  Non-HDL cholesterol 68.   Medications and allergies   Allergies  Allergen Reactions   Bactrim Other (See Comments)    Light headed, passing out    Current Outpatient Medications:    ALOE VERA PO, Take 8 oz by mouth daily., Disp: , Rfl:    ascorbic acid (VITAMIN C) 500 MG tablet, Take 500 mg by mouth daily., Disp: , Rfl:    aspirin 81 MG chewable tablet, Chew 1 tablet (81 mg total) by mouth daily. (Patient taking differently: Chew 162 mg by mouth daily.), Disp: , Rfl:    atorvastatin (LIPITOR) 40 MG tablet, TAKE 1 TABLET(40 MG) BY MOUTH DAILY, Disp: 90 tablet, Rfl: 1   Coenzyme Q10 (Q-10 CO-ENZYME PO), Take 1 packet by mouth daily., Disp: , Rfl:    hydrocortisone (ANUSOL-HC) 25 MG suppository, Place 1 suppository (25 mg total) rectally 2 (two) times daily as needed for hemorrhoids., Disp: 24 suppository, Rfl: 0   losartan-hydrochlorothiazide (HYZAAR) 50-12.5 MG tablet, TAKE 1 TABLET BY MOUTH EVERY MORNING, Disp: 30 tablet, Rfl: 11   metFORMIN (GLUCOPHAGE-XR) 500 MG 24 hr tablet, Take 1,000 mg by mouth daily., Disp: , Rfl:    metoprolol succinate (TOPROL-XL) 50 MG 24 hr tablet, TAKE 1 TABLET BY MOUTH DAILY WITH OR IMMEDIATELY FOLLOWING A MEAL, Disp: 90 tablet, Rfl: 2   Multiple Vitamin (MULTIVITAMIN WITH MINERALS) TABS tablet, Take 2 tablets by mouth daily., Disp: , Rfl:    nitroGLYCERIN (NITROSTAT) 0.4 MG SL tablet, Place 0.4 mg under the tongue every 5 (five) minutes as needed for chest pain., Disp: , Rfl:    Omega-3 Fatty Acids (OMEGA 3 PO), Take 2 capsules by  mouth daily with breakfast., Disp: , Rfl:    OVER THE COUNTER MEDICATION, Forever focus, Disp: , Rfl:    OVER THE COUNTER  MEDICATION, Forever move, Disp: , Rfl:    psyllium (METAMUCIL SMOOTH TEXTURE) 58.6 % powder, Take 1 packet by mouth 3 (three) times daily. 1 tablespoon every night, Disp: 283 g, Rfl: 12   Semaglutide, 2 MG/DOSE, (OZEMPIC, 2 MG/DOSE,) 8 MG/3ML SOPN, Inject 2 mg into the skin once a week., Disp: 3 mL, Rfl: 2   Cardiac Studies:   Exercise sestamibi stress test 08/01/2013: 1. Resting EKG showed normal sinus rhythm, poor R wave progression, Stress EKG was positive for ischemia.  Patient had frequent PVCs, ventricular bigeminy at peak exercise.  Patient had 2 mm horizontal ST segment depression 3 minutes into recovery that persisted for 5.5 minutes into recovery. Patient exercised on BRUCE PROTOCOL for 5 minutes 58 seconds. The maximum work level achieved was 7.3 MET's.  The test was terminated due to achievement of the target heart rate. 2. Perfusion imaging study demonstrates a moderate to large size severe septal ischemia extending from the base to the apex and includes apical anterior and apex.  This reached statistical significance.   Dynamic  QGS  images  reveal septal hypokinesis and apical hypokinesis. Left ventricular ejection fraction was estimated to be 52%.  This represents a high risk study, consider further cardiac workup including coronary angiography if clinically indicated.  Cardiac cath 02/28/2014: Distal RCA 2.75x84m Xience DES. EF 55%. 08/11/2013: Stenting ostial and proximal and mid LAD with overlapping 3.0x28, 3.0x12 Promus premier DES. Large Ramus intermediate with proximal 40% stenosis.  Echocardiogram 06/05/2016: Left ventricle cavity is normal in size. Normal global wall motion. Doppler evidence of grade I (impaired) diastolic dysfunction, normal LAP. Calculated EF 55%. Mild (Grade I) mitral regurgitation. Mild tricuspid regurgitation. No evidence of pulmonary hypertension. Mild pulmonic regurgitation.  EKG:  EKG 07/10/2022: Normal sinus rhythm at rate of 83 bpm.  Nonspecific  lateral T wave flattening.  Compared to 12/27/2021, no significant change.  Assessment     ICD-10-CM   1. Class 1 obesity due to excess calories with serious comorbidity and body mass index (BMI) of 33.0 to 33.9 in adult  E66.09    Z68.33     2. Coronary artery disease involving native coronary artery of native heart without angina pectoris  I25.10 EKG 12-Lead    Semaglutide, 2 MG/DOSE, (OZEMPIC, 2 MG/DOSE,) 8 MG/3ML SOPN    3. Type 2 diabetes mellitus without complication, without long-term current use of insulin (HCC)  E11.9 Semaglutide, 2 MG/DOSE, (OZEMPIC, 2 MG/DOSE,) 8 MG/3ML SOPN      Meds ordered this encounter  Medications   Semaglutide, 2 MG/DOSE, (OZEMPIC, 2 MG/DOSE,) 8 MG/3ML SOPN    Sig: Inject 2 mg into the skin once a week.    Dispense:  3 mL    Refill:  2   Medications Discontinued During This Encounter  Medication Reason   lidocaine (LIDODERM) 5 % Completed Course   OZEMPIC, 1 MG/DOSE, 4 MG/3ML SOPN Dose change      Recommendations:   Emma Combs is a 67y.o. AAF with known coronary artery disease with history of proximal and mid LAD stent placed on 08/11/2013 and distal RCA stent placed on 02/28/2014, presenting with exertional chest pain the 1st time and syncope the 2nd time. She has hyperlipidemia, family history of premature coronary artery disease, DM, obesity and mild chronic thrombocytopenia.  1. Class 1 obesity due to excess calories  with serious comorbidity and body mass index (BMI) of 33.0 to 33.9 in adult Patient was started on Ozempic on 12/27/2021. She is presently on Ozempic for weight loss, she has lost a total of about  25 pounds over the past 5 months.  She is tolerating this well.  I will increase the dose to 2 mg every week.  Presently on 1 mg.  2. Coronary artery disease involving native coronary artery of native heart without angina pectoris Coronary disease standpoint she is doing well, no recurrence of angina pectoris.  EKG reveals normal  sinus rhythm with nonspecific lateral T wave flattening which was unchanged from previous.  Lipids under excellent control.  Blood pressure is also normal.  3. Type 2 diabetes mellitus without complication, without long-term current use of insulin (HCC) Hemoglobin A1c has reduced from 6.6% to 6. 0% since initiation of Ozempic.  She is presently on 1 mg of Ozempic, will increase to 2 mg every week.  Again restrictions regarding diet, slow eating, taking break in between her meal for a few minutes discussed to improve satiety.  Office visit in 3 months.  Reviewed her external labs.   Adrian Prows, MD, [facility] 07/10/2022, 12:28 PM Office: [phone] Fax: [phone] Pager: [phone]

## 2022-07-20 ENCOUNTER — Other Ambulatory Visit: Payer: Self-pay | Admitting: Cardiology

## 2022-07-20 DIAGNOSIS — I10 Essential (primary) hypertension: Secondary | ICD-10-CM

## 2022-08-31 ENCOUNTER — Other Ambulatory Visit: Payer: Self-pay | Admitting: Cardiology

## 2022-10-03 ENCOUNTER — Other Ambulatory Visit: Payer: Self-pay | Admitting: Cardiology

## 2022-10-03 DIAGNOSIS — E119 Type 2 diabetes mellitus without complications: Secondary | ICD-10-CM

## 2022-10-03 DIAGNOSIS — I2510 Atherosclerotic heart disease of native coronary artery without angina pectoris: Secondary | ICD-10-CM

## 2022-10-08 ENCOUNTER — Encounter: Payer: Self-pay | Admitting: Cardiology

## 2022-10-08 ENCOUNTER — Ambulatory Visit: Payer: BC Managed Care – PPO | Admitting: Cardiology

## 2022-10-08 VITALS — BP 120/81 | HR 96 | Resp 16 | Ht 64.0 in | Wt 174.0 lb

## 2022-10-08 DIAGNOSIS — I10 Essential (primary) hypertension: Secondary | ICD-10-CM

## 2022-10-08 DIAGNOSIS — E7800 Pure hypercholesterolemia, unspecified: Secondary | ICD-10-CM

## 2022-10-08 DIAGNOSIS — I2510 Atherosclerotic heart disease of native coronary artery without angina pectoris: Secondary | ICD-10-CM

## 2022-10-08 DIAGNOSIS — E119 Type 2 diabetes mellitus without complications: Secondary | ICD-10-CM

## 2022-10-08 NOTE — Progress Notes (Signed)
Primary Physician/Referring:  Deatra James, MD  Patient ID: Emma Combs, female    DOB: 01-27-1956, 67 y.o.   MRN: [id number]  Chief Complaint  Patient presents with  . Coronary Artery Disease  . Diabetes Mellitus  . Obesity  . Follow-up    3 months   HPI:    Emma Combs  is a 67 y.o. AAF with known coronary artery disease with history of proximal and mid LAD stent placed on 08/11/2013 and distal RCA stent placed on 02/28/2014, presenting with exertional chest pain the 1st time and syncope the 2nd time. She has hyperlipidemia, family history of premature coronary artery disease, DM, obesity and mild chronic thrombocytopenia.  She is presently on Ozempic for weight loss and CAD, she is presently doing well and has improved her BMI status from 35+ to present 30.  She remains asymptomatic.  Past Medical History:  Diagnosis Date  . Anginal pain   . Arthritis    "joints ache; hips" (02/28/2014)  . Borderline diabetes mellitus   . Coronary atherosclerosis of native coronary artery    08/12/2013: PTCA and stenting of the proximal and mid LAD with overlapping 3.0 x 28 mm in the proximal to mid segment and the ostial 3.0 x 12 mm Promus Premier drug-eluting stent. 02/28/2014: Stent distal RCA 2.75x15 Xience Alpine DES. Mod disease in Ramus intermediate. EF 55%  . Glucose intolerance (impaired glucose tolerance)   . High cholesterol   . Obesity   . Uterine fibroid     Social History   Tobacco Use  . Smoking status: Never  . Smokeless tobacco: Never  Substance Use Topics  . Alcohol use: Yes    Alcohol/week: 7.0 standard drinks of alcohol    Types: 7 Glasses of wine per week    Comment: occasionally  Marital Status: Married    ROS  Review of Systems  Cardiovascular:  Negative for chest pain, dyspnea on exertion and leg swelling.  Gastrointestinal:  Negative for melena.   Objective      10/08/2022   11:02 AM 07/10/2022   11:44 AM 04/10/2022   12:12 PM  Vitals with BMI   Height 5\' 4"  5\' 4"  5\' 4"   Weight 174 lbs 186 lbs 10 oz 197 lbs  BMI 29.85 32.01 33.8  Systolic 120 112 409  Diastolic 81 65 72  Pulse 96 88 83    Blood pressure 120/81, pulse 96, resp. rate 16, height 5\' 4"  (1.626 m), weight 174 lb (78.9 kg), SpO2 97 %. Body mass index is 29.87 kg/m.   Physical Exam Neck:     Vascular: No carotid bruit or JVD.  Cardiovascular:     Rate and Rhythm: Normal rate and regular rhythm.     Pulses: Normal pulses and intact distal pulses.     Heart sounds: S1 normal and S2 normal. No murmur heard.    No gallop.  Pulmonary:     Effort: Pulmonary effort is normal.     Breath sounds: Normal breath sounds.  Abdominal:     General: Bowel sounds are normal.     Palpations: Abdomen is soft.  Musculoskeletal:     Right lower leg: No edema.     Left lower leg: No edema.   Radiology: No results found.  Laboratory examination:   Recent Labs    12/13/21 1916 12/13/21 1923  NA 140 140  K 3.6 3.4*  CL 104 102  CO2 27  --   GLUCOSE 96 96  BUN 11 12  CREATININE 0.80 0.80  CALCIUM 10.1  --   GFRNONAA >60  --       Latest Ref Rng & Units 12/13/2021    7:23 PM 12/13/2021    7:16 PM 04/15/2019    9:39 AM  CMP  Glucose 70 - 99 mg/dL 96  96  914   BUN 8 - 23 mg/dL Creatinine 0.44 - 1.00 mg/dL 7.82  9.56  2.13   Sodium 135 - 145 mmol/L 140  140  141   Potassium 3.5 - 5.1 mmol/L 3.4  3.6  4.4   Chloride 98 - 111 mmol/L 102  104  102   CO2 22 - 32 mmol/L  27  27   Calcium 8.9 - 10.3 mg/dL  08.6  9.5       Latest Ref Rng & Units 12/13/2021    7:23 PM 12/13/2021    7:16 PM 01/10/2017    4:55 PM  CBC  WBC 4.0 - 10.5 K/uL  5.4  4.1   Hemoglobin 12.0 - 15.0 g/dL 57.8  46.9  62.9   Hematocrit 36.0 - 46.0 % 41.0  40.3  38.9   Platelets 150 - 400 K/uL  161  112     External labs:  Labs 05/27/2022  A1c 6.0%.  Serum glucose 85 mg, BUN 10, creatinine 0.74, EGFR 89 mL, potassium 4.3, LFTs normal.  Labs 11/27/2021:  A1c 6.6%.  BUN 16,  creatinine 0.81, EGFR 80 mL, potassium 4.5, LFTs normal.  Labs 05/29/2021:  Total cholesterol 145, triglycerides 68, HDL 76, LDL 53.  Non-HDL cholesterol 68.   Medications and allergies   Allergies  Allergen Reactions  . Bactrim Other (See Comments)    Light headed, passing out    Current Outpatient Medications:  .  ALOE VERA PO, Take 8 oz by mouth daily., Disp: , Rfl:  .  ascorbic acid (VITAMIN C) 500 MG tablet, Take 500 mg by mouth daily., Disp: , Rfl:  .  aspirin 81 MG chewable tablet, Chew 1 tablet (81 mg total) by mouth daily. (Patient taking differently: Chew 162 mg by mouth daily.), Disp: , Rfl:  .  atorvastatin (LIPITOR) 40 MG tablet, TAKE 1 TABLET(40 MG) BY MOUTH DAILY, Disp: 90 tablet, Rfl: 1 .  Coenzyme Q10 (Q-10 CO-ENZYME PO), Take 1 packet by mouth daily., Disp: , Rfl:  .  hydrocortisone (ANUSOL-HC) 25 MG suppository, Place 1 suppository (25 mg total) rectally 2 (two) times daily as needed for hemorrhoids., Disp: 24 suppository, Rfl: 0 .  losartan-hydrochlorothiazide (HYZAAR) 50-12.5 MG tablet, TAKE 1 TABLET BY MOUTH EVERY MORNING, Disp: 90 tablet, Rfl: 1 .  metFORMIN (GLUCOPHAGE-XR) 500 MG 24 hr tablet, Take 1,000 mg by mouth daily., Disp: , Rfl:  .  metoprolol succinate (TOPROL-XL) 50 MG 24 hr tablet, TAKE 1 TABLET BY MOUTH DAILY WITH OR IMMEDIATELY FOLLOWING A MEAL, Disp: 90 tablet, Rfl: 2 .  Multiple Vitamin (MULTIVITAMIN WITH MINERALS) TABS tablet, Take 2 tablets by mouth daily., Disp: , Rfl:  .  nitroGLYCERIN (NITROSTAT) 0.4 MG SL tablet, Place 0.4 mg under the tongue every 5 (five) minutes as needed for chest pain., Disp: , Rfl:  .  Omega-3 Fatty Acids (OMEGA 3 PO), Take 2 capsules by mouth daily with breakfast., Disp: , Rfl:  .  OVER THE COUNTER MEDICATION, Forever focus, Disp: , Rfl:  .  OVER THE COUNTER MEDICATION, Forever move, Disp: , Rfl:  .  OZEMPIC, 2 MG/DOSE, 8 MG/3ML SOPN, INJECT 2  MG INTO THE SKIN ONCE EVERY WEEK, Disp: 3 mL, Rfl: 2 .  psyllium (METAMUCIL  SMOOTH TEXTURE) 58.6 % powder, Take 1 packet by mouth 3 (three) times daily. 1 tablespoon every night, Disp: 283 g, Rfl: 12   Cardiac Studies:   Exercise sestamibi stress test 08/01/2013: 1. Resting EKG showed normal sinus rhythm, poor R wave progression, Stress EKG was positive for ischemia.  Patient had frequent PVCs, ventricular bigeminy at peak exercise.  Patient had 2 mm horizontal ST segment depression 3 minutes into recovery that persisted for 5.5 minutes into recovery. Patient exercised on BRUCE PROTOCOL for 5 minutes 58 seconds. The maximum work level achieved was 7.3 MET's.  The test was terminated due to achievement of the target heart rate. 2. Perfusion imaging study demonstrates a moderate to large size severe septal ischemia extending from the base to the apex and includes apical anterior and apex.  This reached statistical significance.   Dynamic  QGS  images  reveal septal hypokinesis and apical hypokinesis. Left ventricular ejection fraction was estimated to be 52%.  This represents a high risk study, consider further cardiac workup including coronary angiography if clinically indicated.  Cardiac cath 02/28/2014: Distal RCA 2.75x23mm Xience DES. EF 55%. 08/11/2013: Stenting ostial and proximal and mid LAD with overlapping 3.0x28, 3.0x12 Promus premier DES. Large Ramus intermediate with proximal 40% stenosis.  Echocardiogram 06/05/2016: Left ventricle cavity is normal in size. Normal global wall motion. Doppler evidence of grade I (impaired) diastolic dysfunction, normal LAP. Calculated EF 55%. Mild (Grade I) mitral regurgitation. Mild tricuspid regurgitation. No evidence of pulmonary hypertension. Mild pulmonic regurgitation.  EKG:  EKG 07/10/2022: Normal sinus rhythm at rate of 83 bpm.  Nonspecific lateral T wave flattening.  Compared to 12/27/2021, no significant change.  Assessment   No diagnosis found.   No orders of the defined types were placed in this encounter.  There  are no discontinued medications.     Recommendations:   Emma Combs  is a 67 y.o. AAF with known coronary artery disease with history of proximal and mid LAD stent placed on 08/11/2013 and distal RCA stent placed on 02/28/2014, presenting with exertional chest pain the 1st time and syncope the 2nd time. She has hyperlipidemia, family history of premature coronary artery disease, DM, obesity and mild chronic thrombocytopenia.  1. Coronary artery disease involving native coronary artery of native heart without angina pectoris Patient is currently doing well and has remained stable without recurrence of angina pectoris.  2. Type 2 diabetes mellitus without complication, without long-term current use of insulin Diabetes is well-controlled now, she is presently tolerating Ozempic, she is lost significant amount of weight, her prior BMI used to be around 35, now reduced to 29.  She feels well and energetic.  She will obtain her labs from her PCP.  3. Essential hypertension Blood pressure under excellent control, on appropriate medical therapy including losartan.  4. Hypercholesteremia Lipids in excellent control, she needs lipid profile testing, she will be seeing Dr. Wynelle Link resume, we obtain labs from her.  I did not make any changes to her medications.  I will see her back in a year or sooner if problems.   Yates Decamp, MD, [facility] 10/08/2022, 12:12 PM Office: [phone] Fax: [phone] Pager: [phone]

## 2022-10-22 ENCOUNTER — Other Ambulatory Visit: Payer: Self-pay | Admitting: Cardiology

## 2022-10-22 DIAGNOSIS — Z9861 Coronary angioplasty status: Secondary | ICD-10-CM

## 2022-11-28 ENCOUNTER — Ambulatory Visit
Admission: RE | Admit: 2022-11-28 | Discharge: 2022-11-28 | Disposition: A | Discharge status: Home / Self Care | Payer: BC Managed Care – PPO | Source: Ambulatory Visit | Attending: Family Medicine | Admitting: Family Medicine

## 2022-11-28 ENCOUNTER — Other Ambulatory Visit: Payer: Self-pay | Admitting: Family Medicine

## 2022-11-28 DIAGNOSIS — M25511 Pain in right shoulder: Secondary | ICD-10-CM

## 2022-12-25 ENCOUNTER — Other Ambulatory Visit: Payer: Self-pay | Admitting: Cardiology

## 2022-12-25 DIAGNOSIS — I2510 Atherosclerotic heart disease of native coronary artery without angina pectoris: Secondary | ICD-10-CM

## 2022-12-25 DIAGNOSIS — E119 Type 2 diabetes mellitus without complications: Secondary | ICD-10-CM

## 2023-01-22 ENCOUNTER — Other Ambulatory Visit: Payer: Self-pay | Admitting: Cardiology

## 2023-01-22 DIAGNOSIS — I10 Essential (primary) hypertension: Secondary | ICD-10-CM

## 2023-03-05 ENCOUNTER — Other Ambulatory Visit: Payer: Self-pay | Admitting: Cardiology

## 2023-03-21 ENCOUNTER — Other Ambulatory Visit: Payer: Self-pay | Admitting: Cardiology

## 2023-03-21 DIAGNOSIS — E119 Type 2 diabetes mellitus without complications: Secondary | ICD-10-CM

## 2023-03-21 DIAGNOSIS — I2510 Atherosclerotic heart disease of native coronary artery without angina pectoris: Secondary | ICD-10-CM

## 2023-05-02 ENCOUNTER — Other Ambulatory Visit: Payer: Self-pay | Admitting: Cardiology

## 2023-05-02 DIAGNOSIS — I10 Essential (primary) hypertension: Secondary | ICD-10-CM

## 2023-09-20 ENCOUNTER — Other Ambulatory Visit: Payer: Self-pay | Admitting: Cardiology

## 2023-10-08 ENCOUNTER — Ambulatory Visit: Payer: Self-pay | Admitting: Cardiology

## 2023-10-21 ENCOUNTER — Other Ambulatory Visit: Payer: Self-pay | Admitting: Cardiology

## 2023-10-29 ENCOUNTER — Other Ambulatory Visit: Payer: Self-pay | Admitting: Cardiology

## 2023-10-29 DIAGNOSIS — Z9861 Coronary angioplasty status: Secondary | ICD-10-CM

## 2023-11-02 ENCOUNTER — Other Ambulatory Visit: Payer: Self-pay | Admitting: Cardiology

## 2023-11-02 DIAGNOSIS — I10 Essential (primary) hypertension: Secondary | ICD-10-CM

## 2023-12-02 ENCOUNTER — Encounter (HOSPITAL_BASED_OUTPATIENT_CLINIC_OR_DEPARTMENT_OTHER): Payer: Self-pay

## 2023-12-04 ENCOUNTER — Ambulatory Visit: Payer: Self-pay | Attending: Cardiology | Admitting: Cardiology

## 2023-12-04 ENCOUNTER — Other Ambulatory Visit: Payer: Self-pay | Admitting: Cardiology

## 2023-12-04 ENCOUNTER — Encounter: Payer: Self-pay | Admitting: Cardiology

## 2023-12-04 VITALS — BP 119/76 | HR 80 | Resp 16 | Ht 64.0 in | Wt 185.6 lb

## 2023-12-04 DIAGNOSIS — I2510 Atherosclerotic heart disease of native coronary artery without angina pectoris: Secondary | ICD-10-CM | POA: Diagnosis not present

## 2023-12-04 DIAGNOSIS — E7800 Pure hypercholesterolemia, unspecified: Secondary | ICD-10-CM | POA: Diagnosis not present

## 2023-12-04 DIAGNOSIS — I10 Essential (primary) hypertension: Secondary | ICD-10-CM

## 2023-12-04 NOTE — Patient Instructions (Signed)
 Medication Instructions:  Your physician recommends that you continue on your current medications as directed. Please refer to the Current Medication list given to you today.  *If you need a refill on your cardiac medications before your next appointment, please call your pharmacy*  Lab Work: Have lab work (lipid profile) checked today at the lab on the first floor If you have labs (blood work) drawn today and your tests are completely normal, you will receive your results only by: MyChart Message (if you have MyChart) OR A paper copy in the mail If you have any lab test that is abnormal or we need to change your treatment, we will call you to review the results.  Testing/Procedures: none  Follow-Up: At [facility], you and your health needs are our priority.  As part of our continuing mission to provide you with exceptional heart care, our providers are all part of one team.  This team includes your primary Cardiologist (physician) and Advanced Practice Providers or APPs (Physician Assistants and Nurse Practitioners) who all work together to provide you with the care you need, when you need it.  Your next appointment:   As needed  Provider:   Knox Perl, MD    We recommend signing up for the patient portal called MyChart.  Sign up information is provided on this After Visit Summary.  MyChart is used to connect with patients for Virtual Visits (Telemedicine).  Patients are able to view lab/test results, encounter notes, upcoming appointments, etc.  Non-urgent messages can be sent to your provider as well.   To learn more about what you can do with MyChart, go to [email or web address].   Other Instructions

## 2023-12-04 NOTE — Progress Notes (Signed)
 Cardiology Office Note:  .   Date:  12/05/2023  ID:  Emma Combs, DOB 11/08/1955, MRN [id number] PCP: Sun, Vyvyan, MD  [facility] Providers Cardiologist:  Gordy Bergamo, MD   History of Present Illness: Emma Combs   Emma Combs is a 68 y.o. AAF with known coronary artery disease with history of proximal and mid LAD stent placed on 08/11/2013 and distal RCA stent placed on 02/28/2014, presenting with exertional chest pain the 1st time and syncope the 2nd time. She has hyperlipidemia, family history of premature coronary artery disease, DM, obesity and mild chronic thrombocytopenia.   Discussed the use of AI scribe software for clinical note transcription with the patient, who gave verbal consent to proceed.  History of Present Illness Emma Combs is a 67 year old female with coronary artery disease and diabetes who presents for a cardiovascular follow-up.  She has experienced weight gain, currently at 185 pounds, after previously reducing to 170 pounds. She is on Ozempic  for weight management and diabetes control. Her diabetes is well controlled with an A1c at a non-diabetic level. She takes metformin once a day and Ozempic .  Her blood pressure is well controlled with metoprolol  and losartan  HCT 50/12.5 mg once a day. She has a history of coronary artery disease with LAD and RCA stents placed in 2015. She is on Lipitor  40 mg for cholesterol management, although her cholesterol levels have not been checked in the past two years.  She is physically active and maintains an overall sense of wellness. No new symptoms reported.   Labs   Lab Results  Component Value Date   CHOL 140 12/04/2023   HDL 83 12/04/2023   LDLCALC 47 12/04/2023   TRIG 39 12/04/2023   CHOLHDL 1.7 12/04/2023     PCP labs on Care Everywhere 11/30/2023:  Care everywhere labs  Serum glucose 85 mg, BUN 13, creatinine 0.82, EGFR 78 mL, potassium 4.3.  A1c 5.7%.    ROS  Review of Systems   Cardiovascular:  Negative for chest pain, dyspnea on exertion and leg swelling.    Physical Exam:   VS:  BP 119/76 (BP Location: Left Arm, Patient Position: Sitting, Cuff Size: Normal)   Pulse 80   Resp 16   Ht 5' 4 (1.626 m)   Wt 185 lb 9.6 oz (84.2 kg)   SpO2 99%   BMI 31.86 kg/m    Wt Readings from Last 3 Encounters:  12/04/23 185 lb 9.6 oz (84.2 kg)  10/08/22 174 lb (78.9 kg)  07/10/22 186 lb 9.6 oz (84.6 kg)    Physical Exam Neck:     Vascular: No carotid bruit or JVD.   Cardiovascular:     Rate and Rhythm: Normal rate and regular rhythm.     Pulses: Intact distal pulses.     Heart sounds: Normal heart sounds. No murmur heard.    No gallop.  Pulmonary:     Effort: Pulmonary effort is normal.     Breath sounds: Normal breath sounds.  Abdominal:     General: Bowel sounds are normal.     Palpations: Abdomen is soft.   Musculoskeletal:     Right lower leg: No edema.     Left lower leg: No edema.    Studies Reviewed: Emma Combs     EKG:    EKG Interpretation Date/Time:  Friday December 04 2023 09:05:25 EDT Ventricular Rate:  80 PR Interval:  190 QRS Duration:  84 QT Interval:  356 QTC Calculation: 410 R  Axis:   26  Text Interpretation: EKG 12/04/2023: Normal sinus rhythm at the rate of 80 bpm, nonspecific T wave flattening.  No significant change from prior EKG. Confirmed by Alohilani Levenhagen, Jagadeesh ([id number]) on 12/04/2023 9:09:13 AM    Medications ordered    No orders of the defined types were placed in this encounter.    ASSESSMENT AND PLAN: .      ICD-10-CM   1. Coronary artery disease involving native coronary artery of native heart without angina pectoris  I25.10 EKG 12-Lead    2. Essential hypertension  I10     3. Hypercholesteremia  E78.00 Lipid Profile      Assessment and Plan Assessment & Plan Coronary Artery Disease Coronary artery disease is well-managed with no changes in EKG. LAD and RCA stents were placed in 2015. She is asymptomatic and adheres to an  effective medication regimen including Lipitor  40 mg. Cholesterol levels have not been checked in two years. - Order cholesterol test today - Continue Lipitor  40 mg daily - Annual follow-up with family doctor - Graduating from regular cardiology follow-up due to well-managed condition, but can return if needed  Hypertension Hypertension is well-controlled with metoprolol  and losartan  HCT. Blood pressure is stable. - Continue metoprolol  and losartan  HCT  Type 2 Diabetes Mellitus Type 2 diabetes mellitus is well-controlled with an A1c of 5.7. Current treatment includes metformin and Ozempic . She has experienced weight loss but is concerned about recent weight gain. Advised to eat slowly and wait five minutes after eating half of the meal to assess hunger, enhancing the effectiveness of Ozempic . - Continue metformin and Ozempic  - Advise to eat slowly and wait five minutes after eating half of the meal to assess hunger - Encourage physical activity  Hypercholesterolemia Patient is presently on atorvastatin  40 mg daily.  I reviewed her lipid profile testing, excellent control of lipids with LDL 47, tolerating Lipitor  40 mg daily, continue the same.   As she has remained stable over a decade, last cardiac catheterization and angioplasty in 2015, I will see her back on a as needed basis and we will request PCP to take over.  Signed,  Gordy Bergamo, MD, [facility] 12/05/2023, 4:32 PM [facility] [address] [place], [place] [address] Phone: [phone]. Fax:  [phone]

## 2023-12-05 ENCOUNTER — Ambulatory Visit: Payer: Self-pay | Admitting: Cardiology

## 2023-12-05 LAB — LIPID PANEL
Chol/HDL Ratio: 1.7 ratio (ref 0.0–4.4)
Cholesterol, Total: 140 mg/dL (ref 100–199)
HDL: 83 mg/dL (ref >39)
LDL Chol Calc (NIH): 47 mg/dL (ref 0–99)
Triglycerides: 39 mg/dL (ref 0–149)
VLDL Cholesterol Cal: 10 mg/dL (ref 5–40)

## 2023-12-05 NOTE — Progress Notes (Signed)
 Labs reviewed, excellent control of cholesterol and will forward to PCP

## 2023-12-07 ENCOUNTER — Telehealth: Payer: Self-pay | Admitting: Cardiology

## 2023-12-07 DIAGNOSIS — I10 Essential (primary) hypertension: Secondary | ICD-10-CM

## 2023-12-07 DIAGNOSIS — Z9861 Coronary angioplasty status: Secondary | ICD-10-CM

## 2023-12-07 MED ORDER — LOSARTAN POTASSIUM-HCTZ 50-12.5 MG PO TABS: 1.0000 | ORAL_TABLET | ORAL | 3 refills | Status: AC

## 2023-12-07 MED ORDER — METOPROLOL SUCCINATE ER 50 MG PO TB24: ORAL_TABLET | ORAL | 3 refills | Status: AC

## 2023-12-07 NOTE — Telephone Encounter (Signed)
 Refills has been sent to the pharmacy.

## 2023-12-07 NOTE — Telephone Encounter (Signed)
*  STAT* If patient is at the pharmacy, call can be transferred to refill team.   1. Which medications need to be refilled? (please list name of each medication and dose if known) losartan -hydrochlorothiazide (HYZAAR) 50-12.5 MG tablet  metoprolol  succinate (TOPROL -XL) 50 MG 24 hr tablet  2. Which pharmacy/location (including street and city if local pharmacy) is medication to be sent to? [id number] [place], [place] - [address] AT [facility] [address] [phone]   3. Do they need a 30 day or 90 day supply? 90

## 2024-01-02 ENCOUNTER — Other Ambulatory Visit: Payer: Self-pay | Admitting: Cardiology

## 2024-03-07 ENCOUNTER — Other Ambulatory Visit: Payer: Self-pay | Admitting: Cardiology

## 2024-03-07 DIAGNOSIS — E119 Type 2 diabetes mellitus without complications: Secondary | ICD-10-CM

## 2024-03-07 DIAGNOSIS — I2510 Atherosclerotic heart disease of native coronary artery without angina pectoris: Secondary | ICD-10-CM

## 2024-03-10 ENCOUNTER — Other Ambulatory Visit (HOSPITAL_COMMUNITY): Payer: Self-pay

## 2024-03-10 ENCOUNTER — Telehealth: Payer: Self-pay | Admitting: Pharmacy Technician

## 2024-03-10 NOTE — Telephone Encounter (Signed)
 Pharmacy Patient Advocate Encounter   Received notification from Fax that prior authorization for ozempic  8MG / is required/requested.   Insurance verification completed.   The patient is insured through [facility] AD[employer]/RX ADV[employer] .   Per test claim: The current 03/10/24 day co-pay is, $24.99- one month.  No PA needed at this time. This test claim was processed through [facility]- copay amounts may vary at other pharmacies due to pharmacy/plan contracts, or as the patient moves through the different stages of their insurance plan.
